# Patient Record
Sex: Female | Born: 1955 | Race: White | Hispanic: No | Marital: Married | Smoking: Never smoker
Health system: Southern US, Community
[De-identification: ages and names within clinical notes are randomized; demographics above are authoritative.]

## PROBLEM LIST (undated history)

## (undated) DIAGNOSIS — N83209 Unspecified ovarian cyst, unspecified side: Secondary | ICD-10-CM

## (undated) DIAGNOSIS — E039 Hypothyroidism, unspecified: Secondary | ICD-10-CM

## (undated) DIAGNOSIS — E7800 Pure hypercholesterolemia, unspecified: Secondary | ICD-10-CM

## (undated) DIAGNOSIS — R002 Palpitations: Secondary | ICD-10-CM

## (undated) DIAGNOSIS — I8390 Asymptomatic varicose veins of unspecified lower extremity: Secondary | ICD-10-CM

## (undated) DIAGNOSIS — M1990 Unspecified osteoarthritis, unspecified site: Secondary | ICD-10-CM

## (undated) HISTORY — DX: Pure hypercholesterolemia, unspecified: E78.00

## (undated) HISTORY — PX: TONSILLECTOMY AND ADENOIDECTOMY: SHX28

## (undated) HISTORY — DX: Unspecified osteoarthritis, unspecified site: M19.90

## (undated) HISTORY — PX: WISDOM TOOTH EXTRACTION: SHX21

## (undated) HISTORY — DX: Hypothyroidism, unspecified: E03.9

## (undated) HISTORY — DX: Unspecified ovarian cyst, unspecified side: N83.209

## (undated) HISTORY — DX: Asymptomatic varicose veins of unspecified lower extremity: I83.90

## (undated) HISTORY — DX: Palpitations: R00.2

---

## 1998-11-17 ENCOUNTER — Other Ambulatory Visit: Admission: RE | Admit: 1998-11-17 | Discharge: 1998-11-17 | Payer: Self-pay | Admitting: Obstetrics and Gynecology

## 1998-12-02 ENCOUNTER — Encounter: Admission: RE | Admit: 1998-12-02 | Discharge: 1999-02-02 | Payer: Self-pay | Admitting: Family Medicine

## 1998-12-11 ENCOUNTER — Encounter: Payer: Self-pay | Admitting: Obstetrics and Gynecology

## 1998-12-11 ENCOUNTER — Ambulatory Visit (HOSPITAL_COMMUNITY): Admission: RE | Admit: 1998-12-11 | Discharge: 1998-12-11 | Payer: Self-pay | Admitting: Obstetrics and Gynecology

## 1998-12-31 ENCOUNTER — Encounter: Payer: Self-pay | Admitting: Obstetrics and Gynecology

## 1998-12-31 ENCOUNTER — Ambulatory Visit (HOSPITAL_COMMUNITY): Admission: RE | Admit: 1998-12-31 | Discharge: 1998-12-31 | Payer: Self-pay | Admitting: Obstetrics and Gynecology

## 1999-10-13 ENCOUNTER — Other Ambulatory Visit: Admission: RE | Admit: 1999-10-13 | Discharge: 1999-10-13 | Payer: Self-pay | Admitting: Obstetrics and Gynecology

## 1999-10-13 ENCOUNTER — Encounter (INDEPENDENT_AMBULATORY_CARE_PROVIDER_SITE_OTHER): Payer: Self-pay | Admitting: Specialist

## 2000-01-01 ENCOUNTER — Encounter: Payer: Self-pay | Admitting: Obstetrics and Gynecology

## 2000-01-01 ENCOUNTER — Ambulatory Visit (HOSPITAL_COMMUNITY): Admission: RE | Admit: 2000-01-01 | Discharge: 2000-01-01 | Payer: Self-pay | Admitting: Obstetrics and Gynecology

## 2000-01-18 ENCOUNTER — Other Ambulatory Visit: Admission: RE | Admit: 2000-01-18 | Discharge: 2000-01-18 | Payer: Self-pay | Admitting: Obstetrics and Gynecology

## 2001-01-09 ENCOUNTER — Ambulatory Visit (HOSPITAL_COMMUNITY): Admission: RE | Admit: 2001-01-09 | Discharge: 2001-01-09 | Payer: Self-pay | Admitting: Obstetrics and Gynecology

## 2001-01-09 ENCOUNTER — Encounter: Payer: Self-pay | Admitting: Obstetrics and Gynecology

## 2001-01-30 ENCOUNTER — Other Ambulatory Visit: Admission: RE | Admit: 2001-01-30 | Discharge: 2001-01-30 | Payer: Self-pay | Admitting: Obstetrics and Gynecology

## 2002-01-12 ENCOUNTER — Ambulatory Visit (HOSPITAL_COMMUNITY): Admission: RE | Admit: 2002-01-12 | Discharge: 2002-01-12 | Payer: Self-pay | Admitting: Obstetrics and Gynecology

## 2002-01-12 ENCOUNTER — Encounter: Payer: Self-pay | Admitting: Obstetrics and Gynecology

## 2002-01-30 ENCOUNTER — Other Ambulatory Visit: Admission: RE | Admit: 2002-01-30 | Discharge: 2002-01-30 | Payer: Self-pay | Admitting: Obstetrics and Gynecology

## 2003-01-21 ENCOUNTER — Encounter: Payer: Self-pay | Admitting: Obstetrics and Gynecology

## 2003-01-21 ENCOUNTER — Ambulatory Visit (HOSPITAL_COMMUNITY): Admission: RE | Admit: 2003-01-21 | Discharge: 2003-01-21 | Payer: Self-pay | Admitting: Obstetrics and Gynecology

## 2003-02-04 ENCOUNTER — Other Ambulatory Visit: Admission: RE | Admit: 2003-02-04 | Discharge: 2003-02-04 | Payer: Self-pay | Admitting: Obstetrics and Gynecology

## 2004-01-28 ENCOUNTER — Ambulatory Visit (HOSPITAL_COMMUNITY): Admission: RE | Admit: 2004-01-28 | Discharge: 2004-01-28 | Payer: Self-pay | Admitting: Obstetrics and Gynecology

## 2004-02-27 ENCOUNTER — Other Ambulatory Visit: Admission: RE | Admit: 2004-02-27 | Discharge: 2004-02-27 | Payer: Self-pay | Admitting: Obstetrics and Gynecology

## 2004-12-08 ENCOUNTER — Ambulatory Visit: Payer: Self-pay | Admitting: Internal Medicine

## 2004-12-09 ENCOUNTER — Ambulatory Visit: Payer: Self-pay | Admitting: Internal Medicine

## 2005-02-03 ENCOUNTER — Encounter: Admission: RE | Admit: 2005-02-03 | Discharge: 2005-02-03 | Payer: Self-pay | Admitting: Podiatry

## 2005-02-22 ENCOUNTER — Ambulatory Visit (HOSPITAL_COMMUNITY): Admission: RE | Admit: 2005-02-22 | Discharge: 2005-02-22 | Payer: Self-pay | Admitting: Obstetrics and Gynecology

## 2005-03-08 ENCOUNTER — Other Ambulatory Visit: Admission: RE | Admit: 2005-03-08 | Discharge: 2005-03-08 | Payer: Self-pay | Admitting: Obstetrics and Gynecology

## 2005-07-15 ENCOUNTER — Ambulatory Visit: Payer: Self-pay | Admitting: Internal Medicine

## 2005-07-22 ENCOUNTER — Ambulatory Visit: Payer: Self-pay | Admitting: Internal Medicine

## 2005-08-03 ENCOUNTER — Ambulatory Visit: Payer: Self-pay | Admitting: Internal Medicine

## 2005-08-12 ENCOUNTER — Ambulatory Visit: Payer: Self-pay

## 2005-08-20 ENCOUNTER — Ambulatory Visit: Payer: Self-pay | Admitting: Internal Medicine

## 2005-08-30 ENCOUNTER — Ambulatory Visit: Payer: Self-pay | Admitting: Cardiology

## 2005-10-04 HISTORY — PX: COLONOSCOPY: SHX174

## 2005-10-22 ENCOUNTER — Ambulatory Visit: Payer: Self-pay | Admitting: Gastroenterology

## 2005-11-16 ENCOUNTER — Ambulatory Visit: Payer: Self-pay | Admitting: Gastroenterology

## 2006-01-28 ENCOUNTER — Ambulatory Visit: Payer: Self-pay | Admitting: Internal Medicine

## 2006-03-01 ENCOUNTER — Ambulatory Visit (HOSPITAL_COMMUNITY): Admission: RE | Admit: 2006-03-01 | Discharge: 2006-03-01 | Payer: Self-pay | Admitting: Obstetrics and Gynecology

## 2006-03-09 ENCOUNTER — Other Ambulatory Visit: Admission: RE | Admit: 2006-03-09 | Discharge: 2006-03-09 | Payer: Self-pay | Admitting: Obstetrics and Gynecology

## 2006-06-27 ENCOUNTER — Ambulatory Visit: Payer: Self-pay | Admitting: Internal Medicine

## 2006-06-30 ENCOUNTER — Ambulatory Visit: Payer: Self-pay | Admitting: Internal Medicine

## 2007-02-16 ENCOUNTER — Encounter: Payer: Self-pay | Admitting: Internal Medicine

## 2007-02-16 ENCOUNTER — Ambulatory Visit: Payer: Self-pay | Admitting: Internal Medicine

## 2007-02-16 DIAGNOSIS — M545 Low back pain, unspecified: Secondary | ICD-10-CM | POA: Insufficient documentation

## 2007-02-16 DIAGNOSIS — Z8719 Personal history of other diseases of the digestive system: Secondary | ICD-10-CM | POA: Insufficient documentation

## 2007-02-16 DIAGNOSIS — E785 Hyperlipidemia, unspecified: Secondary | ICD-10-CM | POA: Insufficient documentation

## 2007-02-16 LAB — CONVERTED CEMR LAB
Basophils Relative: 0.2 % (ref 0.0–1.0)
Bilirubin Urine: NEGATIVE
Direct LDL: 155.8 mg/dL
Eosinophils Relative: 4.3 % (ref 0.0–5.0)
HDL: 53 mg/dL (ref >39.0)
Ketones, urine, test strip: NEGATIVE
Lymphocytes Relative: 23.7 % (ref 12.0–46.0)
Monocytes Absolute: 0.5 10*3/uL (ref 0.2–0.7)
Platelets: 289 10*3/uL (ref 150–400)
Protein, U semiquant: NEGATIVE
RBC: 4.56 M/uL (ref 3.87–5.11)
RDW: 12.6 % (ref 11.5–14.6)
Triglycerides: 116 mg/dL (ref 0–149)
Urobilinogen, UA: NEGATIVE
pH: 5

## 2007-02-21 ENCOUNTER — Encounter: Admission: RE | Admit: 2007-02-21 | Discharge: 2007-02-21 | Payer: Self-pay | Admitting: Internal Medicine

## 2007-02-22 ENCOUNTER — Telehealth: Payer: Self-pay | Admitting: Internal Medicine

## 2007-03-02 ENCOUNTER — Telehealth (INDEPENDENT_AMBULATORY_CARE_PROVIDER_SITE_OTHER): Payer: Self-pay | Admitting: *Deleted

## 2007-03-03 ENCOUNTER — Ambulatory Visit (HOSPITAL_COMMUNITY): Admission: RE | Admit: 2007-03-03 | Discharge: 2007-03-03 | Payer: Self-pay | Admitting: Obstetrics and Gynecology

## 2007-03-13 ENCOUNTER — Other Ambulatory Visit: Admission: RE | Admit: 2007-03-13 | Discharge: 2007-03-13 | Payer: Self-pay | Admitting: Obstetrics and Gynecology

## 2007-03-27 ENCOUNTER — Ambulatory Visit: Payer: Self-pay | Admitting: Internal Medicine

## 2007-03-27 LAB — CONVERTED CEMR LAB
Glucose, Urine, Semiquant: NEGATIVE
Specific Gravity, Urine: 1.01
WBC Urine, dipstick: NEGATIVE
pH: 5

## 2007-04-05 ENCOUNTER — Ambulatory Visit: Payer: Self-pay | Admitting: Internal Medicine

## 2007-04-17 ENCOUNTER — Encounter (INDEPENDENT_AMBULATORY_CARE_PROVIDER_SITE_OTHER): Payer: Self-pay | Admitting: *Deleted

## 2007-06-01 ENCOUNTER — Encounter: Payer: Self-pay | Admitting: Internal Medicine

## 2007-06-06 ENCOUNTER — Ambulatory Visit: Payer: Self-pay | Admitting: Internal Medicine

## 2007-06-22 ENCOUNTER — Encounter: Payer: Self-pay | Admitting: Internal Medicine

## 2007-07-10 ENCOUNTER — Encounter: Payer: Self-pay | Admitting: Internal Medicine

## 2007-08-02 ENCOUNTER — Inpatient Hospital Stay (HOSPITAL_COMMUNITY): Admission: EM | Admit: 2007-08-02 | Discharge: 2007-08-03 | Payer: Self-pay | Admitting: Emergency Medicine

## 2007-08-02 ENCOUNTER — Encounter (INDEPENDENT_AMBULATORY_CARE_PROVIDER_SITE_OTHER): Payer: Self-pay | Admitting: Emergency Medicine

## 2007-08-22 ENCOUNTER — Encounter: Payer: Self-pay | Admitting: Internal Medicine

## 2008-03-12 ENCOUNTER — Ambulatory Visit: Payer: Self-pay | Admitting: Internal Medicine

## 2008-03-15 ENCOUNTER — Ambulatory Visit: Payer: Self-pay | Admitting: Internal Medicine

## 2008-03-18 ENCOUNTER — Telehealth (INDEPENDENT_AMBULATORY_CARE_PROVIDER_SITE_OTHER): Payer: Self-pay | Admitting: *Deleted

## 2008-03-18 LAB — CONVERTED CEMR LAB
BUN: 11 mg/dL (ref 6–23)
Chloride: 106 meq/L (ref 96–112)
Creatinine, Ser: 0.8 mg/dL (ref 0.4–1.2)
Direct LDL: 165.9 mg/dL
Potassium: 4 meq/L (ref 3.5–5.1)
TSH: 9.47 microintl units/mL — ABNORMAL HIGH (ref 0.35–5.50)
Total CHOL/HDL Ratio: 5.2

## 2008-03-19 LAB — CONVERTED CEMR LAB: Vit D, 1,25-Dihydroxy: 38 (ref 30–89)

## 2008-03-20 ENCOUNTER — Telehealth: Payer: Self-pay | Admitting: Internal Medicine

## 2008-05-17 ENCOUNTER — Ambulatory Visit: Payer: Self-pay | Admitting: Internal Medicine

## 2008-05-21 ENCOUNTER — Telehealth (INDEPENDENT_AMBULATORY_CARE_PROVIDER_SITE_OTHER): Payer: Self-pay | Admitting: *Deleted

## 2008-10-04 HISTORY — PX: APPENDECTOMY: SHX54

## 2008-10-15 ENCOUNTER — Ambulatory Visit: Payer: Self-pay | Admitting: Internal Medicine

## 2008-10-18 ENCOUNTER — Telehealth (INDEPENDENT_AMBULATORY_CARE_PROVIDER_SITE_OTHER): Payer: Self-pay | Admitting: *Deleted

## 2008-10-18 LAB — CONVERTED CEMR LAB
Cholesterol: 243 mg/dL (ref 0–200)
TSH: 2.68 microintl units/mL (ref 0.35–5.50)
VLDL: 27 mg/dL (ref 0–40)

## 2009-01-30 ENCOUNTER — Encounter: Admission: RE | Admit: 2009-01-30 | Discharge: 2009-01-30 | Payer: Self-pay | Admitting: Podiatry

## 2009-03-04 ENCOUNTER — Ambulatory Visit (HOSPITAL_COMMUNITY): Admission: RE | Admit: 2009-03-04 | Discharge: 2009-03-04 | Payer: Self-pay | Admitting: Obstetrics and Gynecology

## 2009-05-29 ENCOUNTER — Encounter: Payer: Self-pay | Admitting: Internal Medicine

## 2009-06-23 ENCOUNTER — Encounter: Payer: Self-pay | Admitting: Internal Medicine

## 2009-09-29 ENCOUNTER — Ambulatory Visit (HOSPITAL_COMMUNITY): Admission: RE | Admit: 2009-09-29 | Discharge: 2009-09-29 | Payer: Self-pay | Admitting: Internal Medicine

## 2009-12-16 ENCOUNTER — Telehealth (INDEPENDENT_AMBULATORY_CARE_PROVIDER_SITE_OTHER): Payer: Self-pay | Admitting: *Deleted

## 2010-01-20 ENCOUNTER — Telehealth (INDEPENDENT_AMBULATORY_CARE_PROVIDER_SITE_OTHER): Payer: Self-pay | Admitting: *Deleted

## 2010-02-26 ENCOUNTER — Ambulatory Visit: Payer: Self-pay | Admitting: Internal Medicine

## 2010-02-26 DIAGNOSIS — E039 Hypothyroidism, unspecified: Secondary | ICD-10-CM | POA: Insufficient documentation

## 2010-02-27 ENCOUNTER — Telehealth (INDEPENDENT_AMBULATORY_CARE_PROVIDER_SITE_OTHER): Payer: Self-pay | Admitting: *Deleted

## 2010-02-27 LAB — CONVERTED CEMR LAB
AST: 27 units/L (ref 0–37)
Basophils Absolute: 0 10*3/uL (ref 0.0–0.1)
Bilirubin, Direct: 0.1 mg/dL (ref 0.0–0.3)
Creatinine, Ser: 0.8 mg/dL (ref 0.4–1.2)
Eosinophils Absolute: 0.3 10*3/uL (ref 0.0–0.7)
Eosinophils Relative: 7.6 % — ABNORMAL HIGH (ref 0.0–5.0)
GFR calc non Af Amer: 82.75 mL/min (ref >60)
HDL: 60.7 mg/dL (ref >39.00)
Lymphocytes Relative: 23.4 % (ref 12.0–46.0)
MCV: 96.5 fL (ref 78.0–100.0)
Monocytes Absolute: 0.4 10*3/uL (ref 0.1–1.0)
Neutrophils Relative %: 59.7 % (ref 43.0–77.0)
Platelets: 298 10*3/uL (ref 150.0–400.0)
RBC: 4.37 M/uL (ref 3.87–5.11)
RDW: 14.3 % (ref 11.5–14.6)
Sodium: 140 meq/L (ref 135–145)
TSH: 3.28 microintl units/mL (ref 0.35–5.50)
Total Bilirubin: 0.5 mg/dL (ref 0.3–1.2)
Total CHOL/HDL Ratio: 4
Triglycerides: 90 mg/dL (ref 0.0–149.0)

## 2010-03-19 ENCOUNTER — Ambulatory Visit (HOSPITAL_COMMUNITY): Admission: RE | Admit: 2010-03-19 | Discharge: 2010-03-19 | Payer: Self-pay | Admitting: Obstetrics and Gynecology

## 2010-08-25 ENCOUNTER — Telehealth: Payer: Self-pay | Admitting: Internal Medicine

## 2010-10-25 ENCOUNTER — Encounter: Payer: Self-pay | Admitting: Family Medicine

## 2010-10-26 ENCOUNTER — Encounter: Payer: Self-pay | Admitting: Obstetrics and Gynecology

## 2010-11-01 LAB — CONVERTED CEMR LAB: Pap Smear: NORMAL

## 2010-11-05 NOTE — Progress Notes (Signed)
Summary: due cpx  Phone Note Outgoing Call Call back at [facility] Phone [phone] Call back at Work Phone [phone]   Summary of Call: DUE CPX - FASTING LABS Bonnie Barron  December 16, 2009 11:15 AM     Additional Follow-up for Phone Call Additional follow up Details #2::    LMTCB.Marland KitchenMarland KitchenBarb Merino  December 16, 2009 11:26 AM  PATIENT HAS AN APPT 02/26/2010.Marland KitchenMarland KitchenBarb Merino  December 16, 2009 3:01 PM  Follow-up by: [name],  December 16, 2009 11:27 AM

## 2010-11-05 NOTE — Progress Notes (Signed)
Summary: levothyroxine refill  Phone Note Refill Request Message from:  Fax from Pharmacy on August 25, 2010 9:50 AM  Refills Requested: Medication #1:  SYNTHROID 50 MCG  TABS 1 by mouth once daily - NO ADDITIONAL REFILLS WITHOUT OFFICE VISIT [facility].  [address],  [place], [place]    [phone]    fax [phone]     qty 30  Next Appointment Scheduled: none Initial call taken by: Jerolyn Shin,  August 25, 2010 9:51 AM  Follow-up for Phone Call        send Rx, ok 90 and 3RF Follow-up by: [facility] E. Paz MD,  August 25, 2010 4:47 PM    New/Updated Medications: SYNTHROID 50 MCG  TABS (LEVOTHYROXINE SODIUM) 1 by mouth once daily Prescriptions: SYNTHROID 50 MCG  TABS (LEVOTHYROXINE SODIUM) 1 by mouth once daily  #90 x 3   Entered and Authorized by:   Nolon Rod. Paz MD   Signed by:   Army Fossa CMA on 08/25/2010   Method used:   Electronically to        [facility]* (retail)       [address]       [place], [place]  [address]       Ph: [phone]       Fax: [phone]   RxID:   [id number]

## 2010-11-05 NOTE — Assessment & Plan Note (Signed)
Summary: CPX/FASTING/KDC   Vital Signs:  Patient profile:   55 year old female Height:      66.5 inches Weight:      140.38 pounds BMI:     22.40 Pulse rate:   68 / minute BP sitting:   120 / 70  Vitals Entered By: Kandice Hams (Feb 26, 2010 9:15 AM) CC: cpx   History of Present Illness: CPX feels well   Allergies: 1)  ! Morphine  Past History:  Past Medical History: G2 P2 HYPERLIPIDEMIA, BORDERLINE LUMBAGO-- saw Dr Ethelene Hal  2008, Dx w/ OA; declined local injection COLONOSCOPY 2007 , neg Cardiolite  neg. 08-2005 palpitations, saw cards 2010, ECHO was told neg   Past Surgical History: Reviewed history from 03/12/2008 and no changes required. Appendectomy (07/2007)  Family History: CAD - F (dx in his 65s), M ( dx in her 74s, still living) DM - grandparents  HTN - M, F stroke - F colon Ca - no breast Ca - P aunt dementia - F  Social History: Married, husband had a CABG 2 childred tobacco-- no ETOH-- socially exercise-- walks, active, work out  diet-- eats healthy   Review of Systems General:  Denies fatigue and fever. CV:  Denies chest pain or discomfort and swelling of feet; had palpitations last year saw cards, ECHO neg ,  red yeast rice for high cholesterol, it worked very good, total cholesterol went down to 160 approximately. Resp:  Denies cough and shortness of breath. GI:  Denies bloody stools, diarrhea, nausea, and vomiting. GU:  sees gyn . Psych:  + stress but doing ok .  Physical Exam  General:  alert, well-developed, and well-nourished.   Neck:  no thyromegaly and normal carotid upstroke.   Lungs:  normal respiratory effort, no intercostal retractions, no accessory muscle use, and normal breath sounds.   Heart:  normal rate, regular rhythm, no murmur, and no gallop.   Abdomen:  soft, non-tender, no distention, no masses, no guarding, and no rigidity.   Extremities:  no pretibial edema bilaterally  Psych:  Cognition and judgment appear  intact. Alert and cooperative with normal attention span and concentration.  not anxious appearing and not depressed appearing.     Impression & Recommendations:  Problem # 1:  HEALTH SCREENING (ICD-V70.0) cont w/ healthy life style Cscope (-) 2007, next 2017  Dr Russella Dar female care per gyn  Orders: Venipuncture (82993) TLB-BMP (Basic Metabolic Panel-BMET) (80048-METABOL) TLB-CBC Platelet - w/Differential (85025-CBCD) TLB-Hepatic/Liver Function Pnl (80076-HEPATIC) T-Vitamin D (25-Hydroxy) (71696-78938)  Problem # 2:  HYPERLIPIDEMIA, BORDERLINE (ICD-272.4) long history of borderline hyperlipidemia  despite very good lifestyle started red yeast rice few months ago as recommended by cardiology, her total chol. went  down from the 230s to the 160s. She is currently taking 2 tablets Labs Orders: TLB-Lipid Panel (80061-LIPID)    HDL:51.0 (10/15/2008), 43.6 (03/15/2008)  LDL:DEL (10/15/2008), DEL (03/15/2008)  Chol:243 (10/15/2008), 228 (03/15/2008)  Trig:133 (10/15/2008), 129 (03/15/2008)  Problem # 3:  HYPOTHYROIDISM (ICD-244.9) mild hypothyroidism, labs Her updated medication list for this problem includes:    Synthroid 50 Mcg Tabs (Levothyroxine sodium) .Marland Kitchen... 1 by mouth once daily - no additional refills without office visit  Orders: TLB-TSH (Thyroid Stimulating Hormone) (84443-TSH)  Labs Reviewed: TSH: 2.68 (10/15/2008)    Chol: 243 (10/15/2008)   HDL: 51.0 (10/15/2008)   LDL: DEL (10/15/2008)   TG: 133 (10/15/2008)  Complete Medication List: 1)  Loestrin 1/20 (21) 1-20 Mg-mcg Tabs (Norethindrone acet-ethinyl est) 2)  Omega 3  3)  Mvi  4)  Synthroid 50 Mcg Tabs (Levothyroxine sodium) .Marland Kitchen.. 1 by mouth once daily - no additional refills without office visit 5)  Red Yeast Rice  .Marland Kitchen.. 2 a day 6)  Probiotic  .Marland Kitchen.. 1 a day 7)  Calcium  .Marland Kitchen.. 1 a day 8)  Vitamin D  .... 1 a day  Patient Instructions: 1)  Please schedule a follow-up appointment in 1 year.

## 2010-11-05 NOTE — Progress Notes (Signed)
Summary: results  Phone Note Outgoing Call Call back at Home Phone [phone] Call back at Work Phone [phone]   Reason for Call: Discuss lab or test results Details for Reason: advised patient Vitamin D is very good, in fact she can take half of her current vitamin D dose her cholesterol is good, total cholesterol 219 and the  LDL 152,  lower than her usual numbers . For now continue with red rice yeast , she can increase from 2 to 3  tablets a day  thyroid within normal, continue with Synthroid Good results   Signed by Nolon Rod. Paz MD on 02/27/2010 at 2:45 PM  Summary of Call: left message on machine for pt to return call, copy of labs mailed to pt............... Shary Decamp  Feb 27, 2010 2:49 PM discussed with pt............ Shary Decamp  Feb 27, 2010 3:56 PM

## 2010-11-05 NOTE — Progress Notes (Signed)
Summary: rx  Phone Note Refill Request Message from:  [facility] fax [phone]  Refills Requested: Medication #1:  SYNTHROID 50 MCG  TABS 1 by mouth once daily - NO ADDITIONAL REFILLS WITHOUT OFFICE VISIT. Initial call taken by: Kandice Hams,  January 20, 2010 9:54 AM    Prescriptions: SYNTHROID 50 MCG  TABS (LEVOTHYROXINE SODIUM) 1 by mouth once daily - NO ADDITIONAL REFILLS WITHOUT OFFICE VISIT  #30 x 0   Entered by:   Kandice Hams   Authorized by:   Nolon Rod. Paz MD   Signed by:   Kandice Hams on 01/20/2010   Method used:   Faxed to ...       [facility]* (retail)       [address]       [place], [place]  [address]       Ph: [phone]       Fax: [phone]   RxID:   [id number]

## 2010-12-07 ENCOUNTER — Ambulatory Visit (INDEPENDENT_AMBULATORY_CARE_PROVIDER_SITE_OTHER): Payer: Managed Care, Other (non HMO) | Admitting: Internal Medicine

## 2010-12-07 ENCOUNTER — Other Ambulatory Visit: Payer: Self-pay | Admitting: Internal Medicine

## 2010-12-07 ENCOUNTER — Encounter: Payer: Self-pay | Admitting: Internal Medicine

## 2010-12-07 DIAGNOSIS — E039 Hypothyroidism, unspecified: Secondary | ICD-10-CM

## 2010-12-07 DIAGNOSIS — M2550 Pain in unspecified joint: Secondary | ICD-10-CM

## 2010-12-08 LAB — CBC WITH DIFFERENTIAL/PLATELET
Basophils Absolute: 0 10*3/uL (ref 0.0–0.1)
Basophils Relative: 0.5 % (ref 0.0–3.0)
Eosinophils Absolute: 0.4 10*3/uL (ref 0.0–0.7)
Lymphs Abs: 1.6 10*3/uL (ref 0.7–4.0)
MCV: 92 fl (ref 78.0–100.0)
Monocytes Absolute: 0.4 10*3/uL (ref 0.1–1.0)
Monocytes Relative: 9.7 % (ref 3.0–12.0)
Neutro Abs: 2.1 10*3/uL (ref 1.4–7.7)
RBC: 4.36 Mil/uL (ref 3.87–5.11)
RDW: 13 % (ref 11.5–14.6)

## 2010-12-15 NOTE — Assessment & Plan Note (Signed)
Summary: pain shoulder & thigh/cbs   Vital Signs:  Patient profile:   55 year old female Height:      66.5 inches Weight:      143.13 pounds BMI:     22.84 Pulse rate:   68 / minute Pulse rhythm:   regular BP sitting:   124 / 82  (left arm) Cuff size:   large  Vitals Entered By: Army Fossa CMA (December 07, 2010 2:42 PM) CC: Pt here c/o (R) shoulder and (B) legs cause pain Comments x 5 months mostly @ night when laying down [facility]    History of Present Illness: several monthsago developed  leg pain, mostly at  night and located at both thighs she went to cards,was reommended  to stop red rice yeast   as it may cause myalgias that helped temporarily but then pain resurface  ~ 4 months ago, primarily concentrated aroiund the throcanteric area .  also developed R shoulder pain,  the pain can be intense at times, it  is on and off, bothers her a lot at night and is not changed by exercises or arm use  She went to a urgent care, x-rays were done from her shoulder, was told she had mild DJD  changes, prescribed meloxicam. She couldn't tolerate Meloxicam due to GI symptoms  ROS Denies fevers or  weight loss No headaches Has not started any particular exercise program, she's not doing anything different. She remains active as usual. In fact she has tried to avoid exercise  but  that has not impacted her pain   6 months ago she did stop her birth control pills under the guidance of her gynecologist. She is not emotional, she has developed some hot flashes. She notices that in general her energy level has decreased and her  appetite has gone up.  Denies any classic claudication She has mild, chronic neck and back pain, they are not new symptoms.   Allergies: 1)  ! Morphine  Past History:  Past Medical History: Reviewed history from 02/26/2010 and no changes required. G2 P2 HYPERLIPIDEMIA, BORDERLINE LUMBAGO-- saw Dr Ethelene Hal  2008, Dx w/ OA; declined local  injection COLONOSCOPY 2007 , neg Cardiolite  neg. 08-2005 palpitations, saw cards 2010, ECHO was told neg   Past Surgical History: Reviewed history from 03/12/2008 and no changes required. Appendectomy (07/2007)  Social History: Reviewed history from 02/26/2010 and no changes required. Married, husband had a CABG 2 childred tobacco-- no ETOH-- socially exercise-- walks, active, work out  diet-- eats healthy   Physical Exam  General:  alert, well-developed, and well-nourished.   no apparent distress Head:   palpation over the temples showed no tenderness or swelling Neck:  no masses, no thyromegaly, and normal carotid upstroke.   Lungs:  normal respiratory effort, no intercostal retractions, no accessory muscle use, and normal breath sounds.   Msk:   rotation of the hips normal bilaterally, no pain. Left shoulder normal Right shoulder : range of motion is slightly limited due to pain when elevate the R  arm. no deformities or swelling.  she's not particularly tender at either trochanteric area Pulses:  normal pedal and femoral pulses bilaterally   Extremities:  no pretibial edema bilaterally    Impression & Recommendations:  Problem # 1:  PAIN IN JOINT, MULTIPLE SITES (ICD-719.49) Assessment New pain in the hips and right shoulder Review of systems negative for possible  polymyalgia rheumatica  she is  intolerant to meloxicam Plan:  labs  If labs negative, refer to ortho Tylenol  Orders: TLB-Sedimentation Rate (ESR) (85652-ESR) TLB-CBC Platelet - w/Differential (85025-CBCD) TLB-CK Total Only(Creatine Kinase/CPK) (82550-CK) Specimen Handling (16109)  Problem # 2:  HYPOTHYROIDISM (ICD-244.9)  some fatigue for a few moments, HRT was discontinued by gynecology  we'll check a TSH to be sure they lack of energy is not related with thyroid disease Her updated medication list for this problem includes:    Synthroid 50 Mcg Tabs (Levothyroxine sodium) .Marland Kitchen... 1 by mouth once  daily  Orders: Venipuncture (60454) TLB-TSH (Thyroid Stimulating Hormone) (84443-TSH) Specimen Handling (09811)  Complete Medication List: 1)  Omega 3  2)  Mvi  3)  Synthroid 50 Mcg Tabs (Levothyroxine sodium) .Marland Kitchen.. 1 by mouth once daily 4)  Probiotic  .Marland Kitchen.. 1 a day 5)  Calcium  .Marland Kitchen.. 1 a day  Patient Instructions: 1)   if you are not better in a few weeks but me know 2)  For pain, Tylenol 500 mg 2 tablets every 6 hours as needed. No more than 8 tablets daily 3)  Please schedule a follow-up appointment in 4 months .  fasting, physical exam   Orders Added: 1)  Venipuncture [36415] 2)  TLB-TSH (Thyroid Stimulating Hormone) [84443-TSH] 3)  TLB-Sedimentation Rate (ESR) [85652-ESR] 4)  TLB-CBC Platelet - w/Differential [85025-CBCD] 5)  TLB-CK Total Only(Creatine Kinase/CPK) [82550-CK] 6)  Specimen Handling [99000] 7)  Est. Patient Level IV [91478]

## 2011-01-04 ENCOUNTER — Ambulatory Visit
Admission: RE | Admit: 2011-01-04 | Discharge: 2011-01-04 | Disposition: A | Discharge status: Home / Self Care | Payer: Managed Care, Other (non HMO) | Source: Ambulatory Visit | Attending: Family Medicine | Admitting: Family Medicine

## 2011-01-04 ENCOUNTER — Other Ambulatory Visit: Payer: Self-pay | Admitting: Family Medicine

## 2011-01-04 DIAGNOSIS — R609 Edema, unspecified: Secondary | ICD-10-CM

## 2011-01-04 DIAGNOSIS — R52 Pain, unspecified: Secondary | ICD-10-CM

## 2011-02-16 NOTE — Op Note (Signed)
Bonnie Barron, Bonnie Barron                ACCOUNT NO.:  [id number]   MEDICAL RECORD NO.:  [id number]          PATIENT TYPE:  INP   LOCATION:  1525                         FACILITY:  [facility]   PHYSICIAN:  Lennie Muckle, MD      DATE OF BIRTH:  [birth year]   DATE OF PROCEDURE:  08/02/2007  DATE OF DISCHARGE:                               OPERATIVE REPORT   PREOPERATIVE DIAGNOSIS:  Acute appendicitis.   POSTOPERATIVE DIAGNOSIS:  Acute appendicitis.   OPERATION PERFORMED:  Laparoscopic appendectomy.   SURGEON:  Lennie Muckle, MD   INDICATIONS FOR PROCEDURE:  [name] is a 55 year old female who came  to the emergency room due to right lower abdominal pain which started  approximately 8:30 on August 01, 2007.  The pain was nonrelenting.  She  had a CT scan which showed inflammation around the cecal area.  Exam was  consistent with acute appendicitis.  She received preop Zosyn.  Informed  consent was obtained.   SPECIMENS:  Appendix.   ANESTHESIA:  General endotracheal.   ESTIMATED BLOOD LOSS:  Minimal.   DRAINS:  None.   COMPLICATIONS:  No immediate complications.   DESCRIPTION OF PROCEDURE:  [name] was identified in the  preoperative holding room where she was taken to the operating suite.  She was placed in supine position, received general endotracheal  anesthesia.  Her abdomen was prepped and draped in the usual sterile  fashion.  Appropriate time out procedure indicating patient and  procedure was performed.  Using 0.25% Marcaine and 1% lidocaine the  infraumbilical skin was anesthetized and incision placed using the  Optivu #11 mm trocar was placed in the abdominal cavity under direct  visualization.  After obtaining pneumoperitoneum, the abdomen was  inspected and there was no evidence of injury upon entry into the  abdominal cavity.  A 5 mm trocar was placed in the left lower quadrant  and an additional 5 mm trocar was placed in the right upper quadrant  under  visualization with the camera.  Using blunt graspers, the cecum  which was in the right lower quadrant down near the pelvis was  identified.  The appendix was posterior to the cecum and draping down  into the pelvis there was a small amount of fluid within the abdominal  cavity.  Mild amount of inflammation near the appendix.  The tip of the  appendix was dissected away from the surrounding peritoneum using  electrocautery as well as blunt dissection.  I was able to fully dissect  the peritoneum laterally to pull the appendix medially.  I dissected  near the base of the appendix in order to place the laparoscopic stapler  at the base of the appendix.  This was stapled away from the cecum.  The  appendix mesentery was also stapled across using a vascular load on a  laparoscopic stapler.  The specimen was removed in the Endocatch bag  through the infraumbilical incision.  The abdomen was irrigated.  There  was no evidence of bleeding at the end of the case.  The fascia was  closed  with 0 Vicryl suture at the infraumbilical incision site and was  closed with 4-0 Monocryls.  Steri-Strips were placed with final  dressing.  The patient was then extubated and transported to post  anesthesia care unit in stable condition.      Lennie Muckle, MD  Electronically Signed     ALA/MEDQ  D:  08/02/2007  T:  08/03/2007  Job:  [id number]

## 2011-02-16 NOTE — H&P (Signed)
Bonnie Barron, ROBB                ACCOUNT NO.:  [id number]   MEDICAL RECORD NO.:  [id number]          PATIENT TYPE:  INP   LOCATION:  0108                         [facility]AC[facility]   PHYSICIAN:  Lennie Muckle, MD      DATE OF BIRTH:  06/14/1956   DATE OF ADMISSION:  08/02/2007  DATE OF DISCHARGE:                              HISTORY & PHYSICAL   HISTORY OF PRESENT ILLNESS:  The patient is a 55 year old female, who  began having suprapubic discomfort around 8:30 last night.  She states  over the course of the night the pain was persistent, which progressed  up into her upper quadrant and right flank area.  She states she did  have fevers and chills at home.  Due to the persistence of the pain and  nausea, she came to the emergency room at approximately 4 this morning.  She has not had any diarrhea and has no emesis.  A computed tomography  scan was obtained due to the complaints of the right flank pain.  Unfortunately this was performed without contrast, but the radiologist  said they were able to see inflammation around the appendix.  She also  had a white count elevated to 14,000.   PAST MEDICAL HISTORY:  Past medical history is significant for no  significant past medical history.   SOCIAL HISTORY:  No tobacco or alcohol use.   FAMILY HISTORY:  There is a family history of hypertension.   MEDICATIONS:  Medications include birth control pills.   ALLERGIES:  NO ALLERGIES, BUT SHE DID HAVE A RASH FROM THE MORPHINE  INJECTION A SHORT TIME AGO.   REVIEW OF SYSTEMS:  Review of systems, obtained from the patient, she  says she did have a history of heart palpitations and had a workup which  was negative.  No pulmonary disease, renal disease, or gastrointestinal  disease.  She states she does have a sensitive stomach.   PHYSICAL EXAMINATION:  GENERAL:  On physical examination, she is a  pleasant female, lying on the stretcher, in no acute distress.  VITAL SIGNS:  Temperature is  normal at 98.8, blood pressure is 130/81,  pulse is 105.  ABDOMEN:  The abdomen is soft and tender to palpation in the right lower  quadrant.  No peritoneal signs, but could be early peritoneal signs.  She has no rebound tenderness, but is more tender on the right versus  the left.   ASSESSMENT AND PLAN:  Appendicitis.  Other laboratories are reviewed.  She did have some moderate amount of hemoglobin in her urine.  Hemoglobin and hematocrit of 15.2 and 43.  Sodium 136, potassium 3.5,  blood urea nitrogen and creatinine are 12 and 0.71.  The plan is  laparoscopic appendectomy.  Receive intravenous antibiotics in the  operating room.  Likely home tomorrow with adequate pain control.      Lennie Muckle, MD  Electronically Signed     ALA/MEDQ  D:  08/02/2007  T:  08/02/2007  Job:  [id number]

## 2011-02-17 ENCOUNTER — Other Ambulatory Visit (HOSPITAL_COMMUNITY): Payer: Self-pay | Admitting: Obstetrics and Gynecology

## 2011-02-17 DIAGNOSIS — Z1231 Encounter for screening mammogram for malignant neoplasm of breast: Secondary | ICD-10-CM

## 2011-02-19 NOTE — Discharge Summary (Signed)
NAMEJANIQUE, Bonnie Barron                ACCOUNT NO.:  [id number]   MEDICAL RECORD NO.:  [id number]          PATIENT TYPE:  INP   LOCATION:  1525                         FACILITY:  [facility]   PHYSICIAN:  Lennie Muckle, MD      DATE OF BIRTH:  1955/11/22   DATE OF ADMISSION:  08/02/2007  DATE OF DISCHARGE:  08/03/2007                               DISCHARGE SUMMARY   DIAGNOSIS:  Acute appendicitis.   PROCEDURES:  Laparoscopic appendectomy on August 02, 2007.   [name] is a 55 year old female who was seen in the emergency room  and was found to have exam and radiographic imaging consistent with  acute appendicitis.  She was taken to the operating suite on August 02, 2007, where laparoscopic appendectomy was performed.  Postoperatively,  she was able to have her pain controlled with oral narcotics.  She  received one dose postoperatively of Zosyn.  She did not require  postoperative antibiotics at home.  She was able to be discharged  without incident on postoperative day one.  She will follow up in 2-3  weeks' time.  She is given instructions for no heavy lifting greater  than 20 pounds for 6 weeks and shower daily; wash her incisions.  She is  to call if she develops any further problems.      Lennie Muckle, MD  Electronically Signed     ALA/MEDQ  D:  08/07/2007  T:  08/08/2007  Job:  [id number]

## 2011-03-29 ENCOUNTER — Other Ambulatory Visit: Payer: Self-pay | Admitting: Internal Medicine

## 2011-04-14 ENCOUNTER — Ambulatory Visit (HOSPITAL_COMMUNITY): Payer: Self-pay

## 2011-04-20 ENCOUNTER — Ambulatory Visit (HOSPITAL_COMMUNITY): Payer: Managed Care, Other (non HMO)

## 2011-04-21 ENCOUNTER — Encounter: Payer: Managed Care, Other (non HMO) | Admitting: Internal Medicine

## 2011-04-28 ENCOUNTER — Ambulatory Visit (HOSPITAL_COMMUNITY)
Admission: RE | Admit: 2011-04-28 | Discharge: 2011-04-28 | Disposition: A | Discharge status: Home / Self Care | Payer: Managed Care, Other (non HMO) | Source: Ambulatory Visit | Attending: Obstetrics and Gynecology | Admitting: Obstetrics and Gynecology

## 2011-04-28 DIAGNOSIS — Z1231 Encounter for screening mammogram for malignant neoplasm of breast: Secondary | ICD-10-CM

## 2011-07-14 LAB — URINALYSIS, ROUTINE W REFLEX MICROSCOPIC
Bilirubin Urine: NEGATIVE
Nitrite: NEGATIVE
Specific Gravity, Urine: 1.007
Urobilinogen, UA: 0.2

## 2011-07-14 LAB — CBC
MCHC: 35
RBC: 4.81
WBC: 14 — ABNORMAL HIGH

## 2011-07-14 LAB — BASIC METABOLIC PANEL
CO2: 22
Calcium: 9.7
Creatinine, Ser: 0.71
GFR calc non Af Amer: >60
Potassium: 3.5
Sodium: 136

## 2011-07-14 LAB — DIFFERENTIAL
Basophils Relative: 0
Monocytes Relative: 7
Neutro Abs: 11.6 — ABNORMAL HIGH
Neutrophils Relative %: 83 — ABNORMAL HIGH

## 2011-07-14 LAB — URINE MICROSCOPIC-ADD ON

## 2011-08-11 ENCOUNTER — Other Ambulatory Visit: Payer: Self-pay | Admitting: Podiatry

## 2011-08-11 DIAGNOSIS — M79671 Pain in right foot: Secondary | ICD-10-CM

## 2011-08-16 ENCOUNTER — Other Ambulatory Visit: Payer: Managed Care, Other (non HMO)

## 2011-08-18 ENCOUNTER — Other Ambulatory Visit: Payer: Managed Care, Other (non HMO)

## 2011-08-24 ENCOUNTER — Ambulatory Visit
Admission: RE | Admit: 2011-08-24 | Discharge: 2011-08-24 | Disposition: A | Discharge status: Home / Self Care | Payer: Managed Care, Other (non HMO) | Source: Ambulatory Visit | Attending: Podiatry | Admitting: Podiatry

## 2011-08-24 ENCOUNTER — Other Ambulatory Visit: Payer: Managed Care, Other (non HMO)

## 2011-08-24 DIAGNOSIS — M79671 Pain in right foot: Secondary | ICD-10-CM

## 2011-08-25 ENCOUNTER — Other Ambulatory Visit: Payer: Managed Care, Other (non HMO)

## 2012-03-27 ENCOUNTER — Other Ambulatory Visit: Payer: Self-pay | Admitting: Obstetrics and Gynecology

## 2012-03-27 DIAGNOSIS — Z1231 Encounter for screening mammogram for malignant neoplasm of breast: Secondary | ICD-10-CM

## 2012-05-04 ENCOUNTER — Ambulatory Visit (HOSPITAL_COMMUNITY)
Admission: RE | Admit: 2012-05-04 | Discharge: 2012-05-04 | Disposition: A | Discharge status: Home / Self Care | Payer: Managed Care, Other (non HMO) | Source: Ambulatory Visit | Attending: Obstetrics and Gynecology | Admitting: Obstetrics and Gynecology

## 2012-05-04 DIAGNOSIS — Z1231 Encounter for screening mammogram for malignant neoplasm of breast: Secondary | ICD-10-CM | POA: Insufficient documentation

## 2012-06-15 ENCOUNTER — Ambulatory Visit (INDEPENDENT_AMBULATORY_CARE_PROVIDER_SITE_OTHER): Payer: Managed Care, Other (non HMO) | Admitting: Obstetrics and Gynecology

## 2012-06-15 ENCOUNTER — Encounter: Payer: Self-pay | Admitting: Obstetrics and Gynecology

## 2012-06-15 ENCOUNTER — Ambulatory Visit (INDEPENDENT_AMBULATORY_CARE_PROVIDER_SITE_OTHER): Payer: Managed Care, Other (non HMO)

## 2012-06-15 VITALS — BP 100/60 | HR 78 | Resp 14 | Ht 67.0 in | Wt 140.0 lb

## 2012-06-15 DIAGNOSIS — Z124 Encounter for screening for malignant neoplasm of cervix: Secondary | ICD-10-CM

## 2012-06-15 DIAGNOSIS — N83209 Unspecified ovarian cyst, unspecified side: Secondary | ICD-10-CM | POA: Insufficient documentation

## 2012-06-15 DIAGNOSIS — E079 Disorder of thyroid, unspecified: Secondary | ICD-10-CM | POA: Insufficient documentation

## 2012-06-15 DIAGNOSIS — Z01419 Encounter for gynecological examination (general) (routine) without abnormal findings: Secondary | ICD-10-CM

## 2012-06-15 DIAGNOSIS — Z13820 Encounter for screening for osteoporosis: Secondary | ICD-10-CM

## 2012-06-15 DIAGNOSIS — E7800 Pure hypercholesterolemia, unspecified: Secondary | ICD-10-CM | POA: Insufficient documentation

## 2012-06-15 NOTE — Progress Notes (Signed)
The patient is taking hormone replacement therapy: Estrace 1 mg daily / Progesterone cream 1 g BID followed by Dr Lana Fish The patient  is taking a Calcium supplement. Post-menopausal bleeding:no  Last Pap: was normal August  2012 "WNL" Last mammogram: was normal July  2013 Last DEXA scan : per pt last dexa was years ago. Last colonoscopy:normal 2006 per pt was told to return in 10 years.  Urinary symptoms: none Normal bowel movements: Yes Reports abuse at home: No:   Subjective:    Bonnie Barron is a 56 y.o. female G2P2 who presents for annual exam.  The patient has no complaints today.   The following portions of the patient's history were reviewed and updated as appropriate: allergies, current medications, past family history, past medical history, past social history, past surgical history and problem list.  Review of Systems Pertinent items are noted in HPI. Gastrointestinal:No change in bowel habits, no abdominal pain, no rectal bleeding Genitourinary:negative for dysuria, frequency, hematuria, nocturia and urinary incontinence    Objective:     BP 100/60  Pulse 78  Resp 14  Ht 5\' 7"  (1.702 m)  Wt 140 lb (63.504 kg)  BMI 21.93 kg/m2  Weight:  Wt Readings from Last 1 Encounters:  06/15/12 140 lb (63.504 kg)     BMI: Body mass index is 21.93 kg/(m^2). General Appearance: Alert, appropriate appearance for age. No acute distress HEENT: Grossly normal Neck / Thyroid: Supple, no masses, nodes or enlargement Lungs: clear to auscultation bilaterally Back: No CVA tenderness Breast Exam: No masses or nodes.No dimpling, nipple retraction or discharge. Cardiovascular: Regular rate and rhythm. S1, S2, no murmur Gastrointestinal: Soft, non-tender, no masses or organomegaly Pelvic Exam: Vulva and vagina appear normal. Bimanual exam reveals normal uterus and adnexa. Rectovaginal: normal rectal, no masses Lymphatic Exam: Non-palpable nodes in neck, clavicular, axillary, or  inguinal regions Skin: no rash or abnormalities Neurologic: Normal gait and speech, no tremor  Psychiatric: Alert and oriented, appropriate affect.     Assessment:    Normal gyn exam    Plan:   mammogram pap smear return annually or prn Follow-up:  for annual exam DEXA HRT reviewed with [facility] and [facility]. Pt desires to continue. Only 1 year on HRT

## 2012-06-16 LAB — PAP IG W/ RFLX HPV ASCU

## 2012-06-19 ENCOUNTER — Telehealth: Payer: Self-pay

## 2012-06-19 NOTE — Telephone Encounter (Signed)
Pt notified that DXA was mild osteoporosis per SR. Given the option to come in and discuss.  Pt states she may call for appt.  ld

## 2012-11-30 ENCOUNTER — Encounter: Payer: Self-pay | Admitting: Internal Medicine

## 2012-11-30 ENCOUNTER — Ambulatory Visit (INDEPENDENT_AMBULATORY_CARE_PROVIDER_SITE_OTHER): Payer: 59 | Admitting: Internal Medicine

## 2012-11-30 VITALS — BP 122/80 | HR 74 | Wt 139.0 lb

## 2012-11-30 DIAGNOSIS — E079 Disorder of thyroid, unspecified: Secondary | ICD-10-CM

## 2012-11-30 DIAGNOSIS — H8110 Benign paroxysmal vertigo, unspecified ear: Secondary | ICD-10-CM

## 2012-11-30 LAB — CBC WITH DIFFERENTIAL/PLATELET
Basophils Relative: 0.4 % (ref 0.0–3.0)
Eosinophils Relative: 3.6 % (ref 0.0–5.0)
Lymphocytes Relative: 29.5 % (ref 12.0–46.0)
Monocytes Relative: 9.6 % (ref 3.0–12.0)
Neutrophils Relative %: 56.9 % (ref 43.0–77.0)
RBC: 4.72 Mil/uL (ref 3.87–5.11)
WBC: 5.1 10*3/uL (ref 4.5–10.5)

## 2012-11-30 LAB — BASIC METABOLIC PANEL
Calcium: 9.9 mg/dL (ref 8.4–10.5)
Chloride: 103 mEq/L (ref 96–112)
Creatinine, Ser: 0.7 mg/dL (ref 0.4–1.2)

## 2012-11-30 MED ORDER — MECLIZINE HCL 12.5 MG PO TABS: 12.5000 mg | ORAL_TABLET | Freq: Three times a day (TID) | ORAL | Status: DC | PRN

## 2012-11-30 NOTE — Assessment & Plan Note (Signed)
Symptoms likely peripheral, doubt a central issues such as a stroke. For completeness we'll check a CBC and a BMP. See instructions Prescribe Antivert, warned about drowsiness

## 2012-11-30 NOTE — Patient Instructions (Addendum)
Rest, drink plenty of fluids, Antivert as needed for dizziness. Call if no better in few days or if symptoms severe. Call if you have any sort of weakness, facial numbness, double vision. Scheduled your routine checkup at your convenience.

## 2012-11-30 NOTE — Assessment & Plan Note (Signed)
Good compliance with medication, last TSH at her integrative medicine doctor okay. Plan check a TSH

## 2012-11-30 NOTE — Progress Notes (Signed)
  Subjective:    Patient ID: Bonnie Barron, female    DOB: [birth year], 57 y.o.   MRN: [id number]  HPI Acute visit. Last seen 2012 She was feeling great last night, she was at home, she sat down and suddenly become quite dizzy, described as spinning. She had no nausea. Symptoms lasted several hours, she went to bed, not in that symptoms got worse if she moves her head. She's not doing anything different, not taking any new medications, she actually has a very healthy lifestyle. This morning she feels better but not back to normal. She has a history of hypothyroidism, good medication compliance, last TSH 6 months ago at another   office, reportedly good results.  Past Medical History  Diagnosis Date  . Ovarian cyst   . Varicose veins   . Hypothyroidism   . Hypercholesteremia   . Palpitations     palpitations, saw cards 2010, ECHO was told neg    Past Surgical History  Procedure Laterality Date  . Appendectomy    . Wisdom tooth extraction    . Tonsillectomy and adenoidectomy      Social History: Married, husband had a CABG 2 childred tobacco-- no ETOH-- socially exercise-- walks, active, work out    Review of Systems  denies headache, diplopia. No slurred speech or facial numbness. No focalized weakness. Having occasional palpitations, last time was about a month ago, went to see a cardiologist, no EKG done but   was told symptoms were probably related to a URI that she had at the time.     Objective:   Physical Exam  General -- alert, well-developed, BMI 21.   Neck --no thyromegaly , normal carotid pulse  HEENT --  throat w/o redness, face symmetric and not tender to palpation, EOMI, PERRLA  Lungs -- normal respiratory effort, no intercostal retractions, no accessory muscle use, and normal breath sounds.   Heart-- normal rate, regular rhythm, no murmur, and no gallop.   Extremities-- no pretibial edema bilaterally  Neurologic-- alert & oriented X3, face symmetric,  speech gait and motor are intact. DTRs  normal in all extremities. Psych-- Cognition and judgment appear intact. Alert and cooperative with normal attention span and concentration.  not anxious appearing and not depressed appearing.      Assessment & Plan:

## 2012-12-04 ENCOUNTER — Encounter: Payer: Self-pay | Admitting: *Deleted

## 2012-12-29 ENCOUNTER — Encounter: Payer: Self-pay | Admitting: Internal Medicine

## 2012-12-29 ENCOUNTER — Ambulatory Visit (INDEPENDENT_AMBULATORY_CARE_PROVIDER_SITE_OTHER): Payer: 59 | Admitting: Internal Medicine

## 2012-12-29 VITALS — BP 116/78 | HR 76 | Temp 98.1°F | Wt 140.0 lb

## 2012-12-29 DIAGNOSIS — R140 Abdominal distension (gaseous): Secondary | ICD-10-CM

## 2012-12-29 DIAGNOSIS — R143 Flatulence: Secondary | ICD-10-CM

## 2012-12-29 DIAGNOSIS — R319 Hematuria, unspecified: Secondary | ICD-10-CM

## 2012-12-29 DIAGNOSIS — R109 Unspecified abdominal pain: Secondary | ICD-10-CM

## 2012-12-29 DIAGNOSIS — R141 Gas pain: Secondary | ICD-10-CM

## 2012-12-29 DIAGNOSIS — R350 Frequency of micturition: Secondary | ICD-10-CM

## 2012-12-29 LAB — POCT URINALYSIS DIPSTICK
Bilirubin, UA: NEGATIVE
Ketones, UA: NEGATIVE
Leukocytes, UA: NEGATIVE

## 2012-12-29 NOTE — Progress Notes (Signed)
  Subjective:    Patient ID: Bonnie Barron, female    DOB: March 09, 1956, 57 y.o.   MRN: [id number]  HPI Abdominal pain began 1 month ago  R  lower  quadrant ; it was described as pinching and non radiating . Severity was up to a level 5  ; the discomfort lasted 2  Hours but it resolved w/o Rx. The pain has recurred intermittently  & has been more persistent in past 48 hrs.Treatment with OTC urine cleanser from [facility] Practition[facility]  was partially effective  No significant weight change noted but she has felt bloated for a week. Her last Gyn appt was 8/13. Dysuria, pyuria, and hematuria were absent but frequency for 3 weeks. There was no associated rash or radicular pain in the area of the discomfort; but waist flexion seemed to  increase it today.  Past medical history and family history are negative  for   reflux,  colitis,  colon polyps, or colon cancer. There is a history of  ulcers in her father. PMH of ovarian cyst           Review of Systems Nausea, vomiting, constipation , diarrhea, melena or rectal bleeding were not described. There was no associated dyspepsia ,dysphagia, anorexia or hematemesis.Fever,chills, or sweats were not present.  She denies numbness, tingling, or weakness in her legs. She also denies any incontinence of urine or stool.     Objective:   Physical Exam General appearance : thin but in good health and nourishment w/o distress. Eyes: No conjunctival inflammation or scleral icterus is present. Oral exam: Dental hygiene is good; lips and gums are healthy appearing.There is no oropharyngeal erythema or exudate noted.  Heart:  Normal rate and regular rhythm. S1 and S2 normal without gallop, murmur, click, rub or other extra sounds . Lungs:Chest clear to auscultation; no wheezes, rhonchi,rales ,or rubs present.No increased work of breathing.  Abdomen: bowel sounds normal, soft and non-tender without masses, organomegaly or hernias noted.  No guarding or  rebound.  Musculoskeletal: No spinal malalignment. No tenderness to percussion of the flanks. Negative straight leg raising to 90 plus. Strength and tone normal. Able to lie down and sit up without help  Neuro: Deep tendon reflexes equal Rectal : not performed  Skin:Warm & dry.  Intact without suspicious lesions or rashes ; no jaundice or tenting. Lymphatic: No lymphadenopathy is noted about the head, neck, axilla       Assessment & Plan:  #1 atypical right lower quadrant pain. Waist flexion initiation of pain suggest radicular component; but no evidence of such on exam.  #2 bloating in the context of past history of ovarian cyst  #3 frequency with microscopic hematuria  Plan: Urine will be cultured; CBC and differential ordered. If those studies are negative and symptoms persist; ultrasound would be pursued.

## 2012-12-29 NOTE — Patient Instructions (Addendum)
Order for x-rays entered into  the computer; these will be performed at [facility]. You will be notified of appointment date.

## 2012-12-31 LAB — URINE CULTURE: Colony Count: 7000

## 2013-01-02 ENCOUNTER — Ambulatory Visit (HOSPITAL_BASED_OUTPATIENT_CLINIC_OR_DEPARTMENT_OTHER)
Admission: RE | Admit: 2013-01-02 | Discharge: 2013-01-02 | Disposition: A | Discharge status: Home / Self Care | Payer: 59 | Source: Ambulatory Visit | Attending: Internal Medicine | Admitting: Internal Medicine

## 2013-01-02 ENCOUNTER — Ambulatory Visit (HOSPITAL_BASED_OUTPATIENT_CLINIC_OR_DEPARTMENT_OTHER): Admission: RE | Admit: 2013-01-02 | Payer: 59 | Source: Ambulatory Visit

## 2013-01-02 ENCOUNTER — Other Ambulatory Visit: Payer: Self-pay | Admitting: Internal Medicine

## 2013-01-02 DIAGNOSIS — R1031 Right lower quadrant pain: Secondary | ICD-10-CM

## 2013-01-04 ENCOUNTER — Encounter: Payer: Self-pay | Admitting: Gastroenterology

## 2013-01-15 ENCOUNTER — Encounter: Payer: Self-pay | Admitting: Gastroenterology

## 2013-01-15 ENCOUNTER — Ambulatory Visit (INDEPENDENT_AMBULATORY_CARE_PROVIDER_SITE_OTHER): Payer: 59 | Admitting: Gastroenterology

## 2013-01-15 VITALS — BP 106/70 | HR 80 | Ht 66.0 in | Wt 139.2 lb

## 2013-01-15 DIAGNOSIS — R1031 Right lower quadrant pain: Secondary | ICD-10-CM

## 2013-01-15 NOTE — Progress Notes (Signed)
History of Present Illness: This is a 57 year old female who relates mild right groin pain for the past 2 months. Her symptoms can occur at any time but frequently bother her when seated for prolonged period time or with certain movements. Does not have any relationship to any digestive function. Symptoms tend to last for 1-2 hours and then dissipate. She was evaluated by Dr. Alwyn Ren and pelvic ultrasound was unremarkable. She previously underwent colonoscopy 11/2005 which was normal. Denies weight loss, constipation, diarrhea, change in stool caliber, melena, hematochezia, nausea, vomiting, dysphagia, reflux symptoms, chest pain.  Review of Systems: Pertinent positive and negative review of systems were noted in the above HPI section. All other review of systems were otherwise negative.  Current Medications, Allergies, Past Medical History, Past Surgical History, Family History and Social History were reviewed in [employer] record.  Physical Exam: General: Well developed , well nourished, no acute distress Head: Normocephalic and atraumatic Eyes:  sclerae anicteric, EOMI Ears: Normal auditory acuity Mouth: No deformity or lesions Neck: Supple, no masses or thyromegaly Lungs: Clear throughout to auscultation Heart: Regular rate and rhythm; no murmurs, rubs or bruits Abdomen: Soft, minimal tenderness in her right inguinal region without rebound or guarding and non distended. No masses, hepatosplenomegaly or hernias noted. Normal Bowel sounds Musculoskeletal: Symmetrical with no gross deformities  Skin: No lesions on visible extremities Pulses:  Normal pulses noted Extremities: No clubbing, cyanosis, edema or deformities noted Neurological: Alert oriented x 4, grossly nonfocal Cervical Nodes:  No significant cervical adenopathy Inguinal Nodes: No significant inguinal adenopathy Psychological:  Alert and cooperative. Normal mood and affect  Assessment and  Recommendations:  1. RLQ groin/pelvic pain. This does not appear to be gastrointestinal in etiology. It is possible that she has a small hernia I cannot appreciate on physical exam. Schedule abdominal/pelvic CT scan and obtain stool Hemoccults to further evaluate. Attempt to locate records from prior colonoscopy. Consider GYN evaluation.  2. Colorectal cancer screening, average risk. A routine screening colonoscopy is due in February 2017.

## 2013-01-15 NOTE — Patient Instructions (Addendum)
Please follow instructions on Hemoccult cards and mail them back to [place] when finished.   You have been scheduled for a CT scan of the abdomen and pelvis at [facility] ([address]---this is in the same building as Architectural technologist).   You are scheduled on 01/17/13 at 3:00pm. You should arrive 15 minutes prior to your appointment time for registration. Please follow the written instructions below on the day of your exam:  WARNING: IF YOU ARE ALLERGIC TO IODINE/X-RAY DYE, PLEASE NOTIFY RADIOLOGY IMMEDIATELY AT [phone]! YOU WILL BE GIVEN A 13 HOUR PREMEDICATION PREP.  1) Do not eat or drink anything after 11:00am (4 hours prior to your test) 2) You have been given 2 bottles of oral contrast to drink. The solution may taste better if refrigerated, but do NOT add ice or any other liquid to this solution. Shake well before drinking.    Drink 1 bottle of contrast @ 1:00pm (2 hours prior to your exam)  Drink 1 bottle of contrast @ 2:00pm (1 hour prior to your exam)  You may take any medications as prescribed with a small amount of water except for the following: Metformin, Glucophage, Glucovance, Avandamet, Riomet, Fortamet, Actoplus Met, Janumet, Glumetza or Metaglip. The above medications must be held the day of the exam AND 48 hours after the exam.  The purpose of you drinking the oral contrast is to aid in the visualization of your intestinal tract. The contrast solution may cause some diarrhea. Before your exam is started, you will be given a small amount of fluid to drink. Depending on your individual set of symptoms, you may also receive an intravenous injection of x-ray contrast/dye. Plan on being at [facility] for 30 minutes or long, depending on the type of exam you are having performed.  This test typically takes 30-45 minutes to complete.  If you have any questions regarding your exam or if you need to reschedule, you may call the CT department at [phone]  between the hours of 8:00 am and 5:00 pm, Monday-Friday.  ________________________________________________________________________  Thank you for choosing me and [facility].  Venita Lick. Pleas Koch., MD., Clementeen Graham

## 2013-01-17 ENCOUNTER — Ambulatory Visit (INDEPENDENT_AMBULATORY_CARE_PROVIDER_SITE_OTHER)
Admission: RE | Admit: 2013-01-17 | Discharge: 2013-01-17 | Disposition: A | Discharge status: Home / Self Care | Payer: 59 | Source: Ambulatory Visit | Attending: Gastroenterology | Admitting: Gastroenterology

## 2013-01-17 DIAGNOSIS — R1031 Right lower quadrant pain: Secondary | ICD-10-CM

## 2013-01-17 MED ORDER — IOHEXOL 300 MG/ML  SOLN
100.0000 mL | Freq: Once | INTRAMUSCULAR | Status: AC | PRN
  Administered 2013-01-17: 100 mL via INTRAVENOUS

## 2013-01-26 ENCOUNTER — Telehealth: Payer: Self-pay | Admitting: Gastroenterology

## 2013-01-26 NOTE — Telephone Encounter (Signed)
Patient advised that the results are normal

## 2013-01-30 ENCOUNTER — Other Ambulatory Visit (INDEPENDENT_AMBULATORY_CARE_PROVIDER_SITE_OTHER): Payer: 59

## 2013-01-30 DIAGNOSIS — E0590 Thyrotoxicosis, unspecified without thyrotoxic crisis or storm: Secondary | ICD-10-CM

## 2013-01-30 DIAGNOSIS — R1031 Right lower quadrant pain: Secondary | ICD-10-CM

## 2013-01-30 LAB — HEMOCCULT SLIDES (X 3 CARDS)
Fecal Occult Blood: NEGATIVE
OCCULT 5: NEGATIVE

## 2013-02-20 ENCOUNTER — Ambulatory Visit (INDEPENDENT_AMBULATORY_CARE_PROVIDER_SITE_OTHER): Payer: 59 | Admitting: Internal Medicine

## 2013-02-20 ENCOUNTER — Encounter: Payer: Self-pay | Admitting: Internal Medicine

## 2013-02-20 VITALS — BP 112/76 | HR 79 | Temp 98.9°F | Ht 67.0 in | Wt 141.0 lb

## 2013-02-20 DIAGNOSIS — Z23 Encounter for immunization: Secondary | ICD-10-CM

## 2013-02-20 DIAGNOSIS — Z2911 Encounter for prophylactic immunotherapy for respiratory syncytial virus (RSV): Secondary | ICD-10-CM

## 2013-02-20 DIAGNOSIS — Z0000 Encounter for general adult medical examination without abnormal findings: Secondary | ICD-10-CM

## 2013-02-20 DIAGNOSIS — E039 Hypothyroidism, unspecified: Secondary | ICD-10-CM

## 2013-02-20 NOTE — Assessment & Plan Note (Addendum)
Has been off Synthroid for more than 2 years, has followup with a alternative medicine doctor and taking "naturethroid". TSH levels have been stable. Plan: Check TSH every 4 months. Prescribe synthroid if needed.

## 2013-02-20 NOTE — Patient Instructions (Addendum)
Please come back this week for labs: FLP, AST, ALT dx V70 ----- Please  check your thyroid every 4 months. Call for an appointment in 4 months (TSH, hypothyroidism) --- Next visit with me in one year.

## 2013-02-20 NOTE — Progress Notes (Signed)
  Subjective:    Patient ID: Bonnie Barron, female    DOB: [birth year], 57 y.o.   MRN: [id number]  HPI CPX  Past Medical History  Diagnosis Date  . Ovarian cyst   . Varicose veins   . Hypothyroidism   . Hypercholesteremia   . Palpitations     palpitations, saw cards 2010, ECHO was told neg    Past Surgical History  Procedure Laterality Date  . Appendectomy  2010  . Wisdom tooth extraction    . Tonsillectomy and adenoidectomy     History   Social History  . Marital Status: Married    Spouse Name: N/A    Number of Children: 2  . Years of Education: N/A   Occupational History  . homemaker    Social History Main Topics  . Smoking status: Never Smoker   . Smokeless tobacco: Never Used  . Alcohol Use: Yes     Comment: 3 times per week  . Drug Use: No  . Sexually Active: Yes    Birth Control/ Protection: Post-menopausal   Other Topics Concern  . Not on file   Social History Narrative   husband had a CABG, doing well              Family History  Problem Relation Age of Onset  . Heart disease Mother     M and F .late in life  . Stroke Mother     M and F ,late in life  . Hypertension Mother   . Deep vein thrombosis Father   . Ulcers Father   . Colon polyps Brother     40-50  . Breast cancer Neg Hx   . Colon cancer Neg Hx     Review of Systems Diet is healthy most days. Very active, exercises 5 times a week. Had some heart fluttering few weeks ago, symptoms resolved. Had abdominal pain, saw GI, had a CT. Symptoms essentially resolved. No chest pain or shortness or breath No nausea, vomiting, diarrhea blood in the stools. No anxiety- depression.     Objective:   Physical Exam BP 112/76  Pulse 79  Temp(Src) 98.9 F (37.2 C) (Oral)  Ht 5\' 7"  (1.702 m)  Wt 141 lb (63.957 kg)  BMI 22.08 kg/m2  SpO2 98%  General -- alert, well-developed, nad .   Neck --no thyromegaly , normal carotid pulse Lungs -- normal respiratory effort, no intercostal  retractions, no accessory muscle use, and normal breath sounds.   Heart-- normal rate, regular rhythm, no murmur, and no gallop.   Abdomen--soft, non-tender, no distention, no masses, no HSM, no guarding, and no rigidity.   Extremities-- no pretibial edema bilaterally Neurologic-- alert & oriented X3 and strength normal in all extremities. Psych-- Cognition and judgment appear intact. Alert and cooperative with normal attention span and concentration.  not anxious appearing and not depressed appearing.       Assessment & Plan:

## 2013-02-20 NOTE — Assessment & Plan Note (Signed)
cont w/ healthy life style Tdap - today zostavax discussed ,provided  today Cscope (-) 2007, next 2017  Dr Russella Dar female care per gyn, Dr Estanislado Pandy States is update on [facility] On HRT

## 2013-02-21 ENCOUNTER — Encounter: Payer: Self-pay | Admitting: Internal Medicine

## 2013-03-16 ENCOUNTER — Other Ambulatory Visit (INDEPENDENT_AMBULATORY_CARE_PROVIDER_SITE_OTHER): Payer: 59

## 2013-03-16 DIAGNOSIS — Z0000 Encounter for general adult medical examination without abnormal findings: Secondary | ICD-10-CM

## 2013-03-16 LAB — LIPID PANEL
Cholesterol: 223 mg/dL — ABNORMAL HIGH (ref 0–200)
HDL: 69.2 mg/dL
Total CHOL/HDL Ratio: 3
Triglycerides: 118 mg/dL (ref 0.0–149.0)
VLDL: 23.6 mg/dL (ref 0.0–40.0)

## 2013-03-16 LAB — CBC WITH DIFFERENTIAL/PLATELET
Basophils Absolute: 0 K/uL (ref 0.0–0.1)
Basophils Relative: 0.7 % (ref 0.0–3.0)
Eosinophils Absolute: 0.3 K/uL (ref 0.0–0.7)
Eosinophils Relative: 6.8 % — ABNORMAL HIGH (ref 0.0–5.0)
HCT: 42.3 % (ref 36.0–46.0)
Hemoglobin: 14.4 g/dL (ref 12.0–15.0)
Lymphocytes Relative: 31.8 % (ref 12.0–46.0)
Lymphs Abs: 1.5 K/uL (ref 0.7–4.0)
MCHC: 34.2 g/dL (ref 30.0–36.0)
MCV: 91.7 fl (ref 78.0–100.0)
Monocytes Absolute: 0.4 K/uL (ref 0.1–1.0)
Monocytes Relative: 9.4 % (ref 3.0–12.0)
Neutro Abs: 2.3 K/uL (ref 1.4–7.7)
Neutrophils Relative %: 51.3 % (ref 43.0–77.0)
Platelets: 240 K/uL (ref 150.0–400.0)
RBC: 4.61 Mil/uL (ref 3.87–5.11)
RDW: 13.8 % (ref 11.5–14.6)
WBC: 4.6 K/uL (ref 4.5–10.5)

## 2013-03-16 LAB — ALT: ALT: 22 U/L (ref 0–35)

## 2013-03-16 LAB — LDL CHOLESTEROL, DIRECT: Direct LDL: 137.3 mg/dL

## 2013-04-05 ENCOUNTER — Other Ambulatory Visit: Payer: Self-pay | Admitting: Obstetrics and Gynecology

## 2013-04-05 DIAGNOSIS — Z1231 Encounter for screening mammogram for malignant neoplasm of breast: Secondary | ICD-10-CM

## 2013-05-08 ENCOUNTER — Ambulatory Visit (HOSPITAL_COMMUNITY): Payer: 59

## 2013-05-18 ENCOUNTER — Ambulatory Visit (HOSPITAL_COMMUNITY): Payer: 59

## 2013-05-21 ENCOUNTER — Ambulatory Visit (HOSPITAL_COMMUNITY)
Admission: RE | Admit: 2013-05-21 | Discharge: 2013-05-21 | Disposition: A | Discharge status: Home / Self Care | Payer: 59 | Source: Ambulatory Visit | Attending: Obstetrics and Gynecology | Admitting: Obstetrics and Gynecology

## 2013-05-21 DIAGNOSIS — Z1231 Encounter for screening mammogram for malignant neoplasm of breast: Secondary | ICD-10-CM | POA: Insufficient documentation

## 2013-06-24 ENCOUNTER — Encounter: Payer: Self-pay | Admitting: Internal Medicine

## 2013-08-09 ENCOUNTER — Other Ambulatory Visit: Payer: Self-pay

## 2013-11-20 ENCOUNTER — Ambulatory Visit: Payer: Self-pay | Admitting: Podiatry

## 2013-11-20 ENCOUNTER — Other Ambulatory Visit: Payer: Self-pay | Admitting: Obstetrics and Gynecology

## 2013-11-27 ENCOUNTER — Ambulatory Visit: Payer: Self-pay | Admitting: Podiatry

## 2013-12-11 ENCOUNTER — Ambulatory Visit (INDEPENDENT_AMBULATORY_CARE_PROVIDER_SITE_OTHER): Payer: BC Managed Care – PPO | Admitting: Internal Medicine

## 2013-12-11 ENCOUNTER — Encounter: Payer: Self-pay | Admitting: Internal Medicine

## 2013-12-11 ENCOUNTER — Ambulatory Visit: Payer: Self-pay | Admitting: Podiatry

## 2013-12-11 VITALS — BP 118/72 | HR 90 | Temp 98.8°F | Wt 145.0 lb

## 2013-12-11 DIAGNOSIS — E039 Hypothyroidism, unspecified: Secondary | ICD-10-CM

## 2013-12-11 DIAGNOSIS — J069 Acute upper respiratory infection, unspecified: Secondary | ICD-10-CM

## 2013-12-11 MED ORDER — GUAIFENESIN-CODEINE 100-10 MG/5ML PO SOLN: 5.0000 mL | Freq: Two times a day (BID) | ORAL | Status: DC | PRN

## 2013-12-11 MED ORDER — AZITHROMYCIN 250 MG PO TABS: ORAL_TABLET | ORAL | Status: DC

## 2013-12-11 NOTE — Patient Instructions (Signed)
Rest, fluids , tylenol For cough, take Mucinex DM twice a day as needed  If the cough continue, take codeine as prescribed, will cause drowsiness  For congestion use OTC Nasocort: 2 nasal sprays on each side of the nose daily until you feel better  Take the antibiotic as prescribed  (zithromax ) Call if no better in few days Call anytime if the symptoms are severe

## 2013-12-11 NOTE — Progress Notes (Signed)
Subjective:    Patient ID: Bonnie Barron, female    DOB: [birth year], 58 y.o.   MRN: [id number]  DOS:  12/11/2013 Type of  visit: Acute visit  Symptoms started a week ago with hoarseness, mucus accumulation in the throat, persistent dry cough. Yesterday, she developed nasal pain and congestion and saw some bloody nasal discharge. Taking a number of different OTCs, yesterday she took codeine x 2  from her husband and it did help the cough.   ROS No fever or chills Mild nausea with episodes of cough. No vomiting, diarrhea. No chest pain or difficulty breathing  Past Medical History  Diagnosis Date  . Ovarian cyst   . Varicose veins   . Hypothyroidism   . Hypercholesteremia   . Palpitations     palpitations, saw cards 2010, ECHO was told neg     Past Surgical History  Procedure Laterality Date  . Appendectomy  2010  . Wisdom tooth extraction    . Tonsillectomy and adenoidectomy      History   Social History  . Marital Status: Married    Spouse Name: N/A    Number of Children: 2  . Years of Education: N/A   Occupational History  . homemaker    Social History Main Topics  . Smoking status: Never Smoker   . Smokeless tobacco: Never Used  . Alcohol Use: Yes     Comment: 3 times per week  . Drug Use: No  . Sexual Activity: Yes    Birth Control/ Protection: Post-menopausal   Other Topics Concern  . Not on file   Social History Narrative   husband had a CABG, doing well                   Medication List       This list is accurate as of: 12/11/13  7:44 PM.  Always use your most recent med list.               AMBULATORY NON FORMULARY MEDICATION  - Nature Thyroid .75 grain  - Take 1 tablet by mouth twice a day     azithromycin 250 MG tablet  Commonly known as:  ZITHROMAX Z-PAK  As directed     Co Q 10 100 MG Caps  Take 1 capsule by mouth daily.     DHEA PO  Take 1 tablet by mouth daily. 15mg      estradiol 1 MG tablet  Commonly known as:   ESTRACE  Take 1 mg by mouth daily.     fish oil-omega-3 fatty acids 1000 MG capsule  Take 2 g by mouth daily.     guaiFENesin-codeine 100-10 MG/5ML syrup  Take 5 mLs by mouth 2 (two) times daily as needed for cough.     levothyroxine 50 MCG tablet  Commonly known as:  SYNTHROID, LEVOTHROID     multivitamin tablet  Take 1 tablet by mouth daily.     NIACIN PO  Take 1 tablet by mouth daily. 640mg      NON FORMULARY  2 tablets daily. Nature thyroid 1G     PROBIOTIC DAILY PO  Take 1 tablet by mouth daily.     progesterone 100 MG capsule  Commonly known as:  PROMETRIUM     Vitamin D 2000 UNITS Caps  Take 1 capsule by mouth daily.           Objective:   Physical Exam BP 118/72  Pulse 90  Temp(Src) 98.8 F (  37.1 C)  Wt 145 lb (65.772 kg)  SpO2 94% General -- alert, well-developed, NAD.  HEENT-- Not pale. TMs Lnormal, R obscured by wax, no d/c; throat symmetric, no redness or discharge. Face symmetric, sinuses not tender to palpation. Nose quite congested.  Lungs -- normal respiratory effort, no intercostal retractions, no accessory muscle use, and normal breath sounds.  Heart-- normal rate, regular rhythm, no murmur.   Extremities-- no pretibial edema bilaterally  Neurologic--  alert & oriented X3. Speech normal, gait normal, strength normal in all extremities.   EOMI  Psych-- Cognition and judgment appear intact. Cooperative with normal attention span and concentration. No anxious or depressed appearing.      Assessment & Plan:  URI, See instructions

## 2013-12-11 NOTE — Assessment & Plan Note (Signed)
Followup by another provider per pt

## 2013-12-11 NOTE — Progress Notes (Signed)
Pre visit review using our clinic review tool, if applicable. No additional management support is needed unless otherwise documented below in the visit note. 

## 2014-01-17 ENCOUNTER — Ambulatory Visit (INDEPENDENT_AMBULATORY_CARE_PROVIDER_SITE_OTHER): Payer: BC Managed Care – PPO

## 2014-01-17 ENCOUNTER — Encounter: Payer: Self-pay | Admitting: Podiatry

## 2014-01-17 ENCOUNTER — Ambulatory Visit (INDEPENDENT_AMBULATORY_CARE_PROVIDER_SITE_OTHER): Payer: BC Managed Care – PPO | Admitting: Podiatry

## 2014-01-17 VITALS — BP 129/75 | HR 67 | Resp 16 | Ht 67.0 in | Wt 142.0 lb

## 2014-01-17 DIAGNOSIS — M778 Other enthesopathies, not elsewhere classified: Secondary | ICD-10-CM

## 2014-01-17 DIAGNOSIS — M7750 Other enthesopathy of unspecified foot: Secondary | ICD-10-CM

## 2014-01-17 DIAGNOSIS — M722 Plantar fascial fibromatosis: Secondary | ICD-10-CM

## 2014-01-17 DIAGNOSIS — M779 Enthesopathy, unspecified: Principal | ICD-10-CM

## 2014-01-17 DIAGNOSIS — M76829 Posterior tibial tendinitis, unspecified leg: Secondary | ICD-10-CM

## 2014-01-17 MED ORDER — METHYLPREDNISOLONE (PAK) 4 MG PO TABS: ORAL_TABLET | ORAL | Status: DC

## 2014-01-17 NOTE — Progress Notes (Signed)
   Subjective:    Patient ID: Bonnie Barron, female    DOB: [birth year], 58 y.o.   MRN: [id number]  HPI Comments: The right foot , having pain with it. Its the whole foot , its been ever since i sprained my ankle [age] thirteen years ago. It bothers me for a while than it goes away . It comes an goes , saw dr Milinda Pointer last for this in 2013  Foot Pain      Review of Systems     Objective:   Physical Exam: I have reviewed her past medical history medications allergies surgeries social history. Pulses are palpable bilateral. Neurologic sensorium is intact. Orthopedic evaluation demonstrates all joints distal to the ankle have a full range of motion without crepitus. She does have pain on palpation to the medial calcaneal tubercle of the right heel. She also has pain on palpation to the posterior tibial tendon as it courses beneath the medial malleolus extending to the level of the navicular tuberosity. She also has pain on end range of motion of the second metatarsophalangeal joint. Radiographic evaluation demonstrates extremely long second metatarsal with resultant hammertoe deformity. She also has soft tissue increase in density at the plantar fascial calcaneal insertion site indicative of plantar fasciitis.        Assessment & Plan:  Assessment: Plantar fasciitis with posterior tibial tendinitis. 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  Plan: Injected the right heel today. Sterapred Dosepak was dispensed. Plantar fascial brace was dispensed a night splint was dispensed both oral and written home-going instructions were provided she also received an injection at the point of maximal tenderness with Kenalog and local anesthetic. I will followup with her in one month.

## 2014-02-19 ENCOUNTER — Ambulatory Visit: Payer: BC Managed Care – PPO | Admitting: Podiatry

## 2014-04-16 ENCOUNTER — Ambulatory Visit (INDEPENDENT_AMBULATORY_CARE_PROVIDER_SITE_OTHER): Payer: BC Managed Care – PPO | Admitting: Internal Medicine

## 2014-04-16 ENCOUNTER — Encounter: Payer: Self-pay | Admitting: Internal Medicine

## 2014-04-16 VITALS — BP 124/75 | HR 89 | Temp 98.2°F | Ht 67.1 in | Wt 144.0 lb

## 2014-04-16 DIAGNOSIS — Z0000 Encounter for general adult medical examination without abnormal findings: Secondary | ICD-10-CM

## 2014-04-16 DIAGNOSIS — E039 Hypothyroidism, unspecified: Secondary | ICD-10-CM

## 2014-04-16 NOTE — Progress Notes (Signed)
Pre visit review using our clinic review tool, if applicable. No additional management support is needed unless otherwise documented below in the visit note. 

## 2014-04-16 NOTE — Assessment & Plan Note (Signed)
Followup elsewhere, currently taking Synthroid and a natural thyroid pill. We agreed on checking her TFTs.

## 2014-04-16 NOTE — Progress Notes (Signed)
Subjective:    Patient ID: Bonnie Barron, female    DOB: Feb 04, 1956, 58 y.o.   MRN: [id number]  DOS:  04/16/2014 Type of visit - description: CPX   ROS Diet, Exercise-- doing well   No  CP, SOB Denies  nausea, vomiting diarrhea, blood in the stools (-) cough, sputum production (-) wheezing, chest congestion No dysuria, gross hematuria, difficulty urinating  No headaches  Denies dizziness      Past Medical History  Diagnosis Date  . Ovarian cyst   . Varicose veins   . Hypothyroidism   . Hypercholesteremia   . Palpitations     palpitations, saw cards 2010, ECHO was told neg     Past Surgical History  Procedure Laterality Date  . Appendectomy  2010  . Wisdom tooth extraction    . Tonsillectomy and adenoidectomy      History   Social History  . Marital Status: Married    Spouse Name: N/A    Number of Children: 2  . Years of Education: N/A   Occupational History  . homemaker    Social History Main Topics  . Smoking status: Never Smoker   . Smokeless tobacco: Never Used  . Alcohol Use: Yes     Comment: 3 times per week  . Drug Use: No  . Sexual Activity: Yes    Birth Control/ Protection: Post-menopausal   Other Topics Concern  . Not on file   Social History Narrative   husband just dx w/ prostate ca                  Family History  Problem Relation Age of Onset  . Heart disease Mother     M and F .late in life  . Stroke Mother     M and F ,late in life  . Hypertension Mother   . Deep vein thrombosis Father   . Ulcers Father   . Colon polyps Brother     40-50  . Breast cancer Neg Hx   . Colon cancer Neg Hx        Medication List       This list is accurate as of: 04/16/14 11:59 PM.  Always use your most recent med list.               AMBULATORY NON FORMULARY MEDICATION  - Nature Thyroid .75 grain  - Take 1 tablet by mouth twice a day     ANGELIQ 0.5-1 MG per tablet  Generic drug:  drospirenone-estradiol     Co Q 10 100 MG  Caps  Take 1 capsule by mouth daily.     DHEA PO  Take 1 tablet by mouth daily. 15mg      fish oil-omega-3 fatty acids 1000 MG capsule  Take 2 g by mouth daily.     levothyroxine 50 MCG tablet  Commonly known as:  SYNTHROID, LEVOTHROID     multivitamin tablet  Take 1 tablet by mouth daily.     PROBIOTIC DAILY PO  Take 1 tablet by mouth daily.     Vitamin D 2000 UNITS Caps  Take 1 capsule by mouth daily.           Objective:   Physical Exam BP 124/75  Pulse 89  Temp(Src) 98.2 F (36.8 C)  Ht 5' 7.1" (1.704 m)  Wt 144 lb (65.318 kg)  BMI 22.50 kg/m2  SpO2 98% General -- alert, well-developed, NAD.  Neck --no thyromegaly , normal carotid  pulse  HEENT-- Not pale.   Lungs -- normal respiratory effort, no intercostal retractions, no accessory muscle use, and normal breath sounds.  Heart-- normal rate, regular rhythm, no murmur.  Abdomen-- Not distended, good bowel sounds,soft, non-tender.  Extremities-- no pretibial edema bilaterally  Neurologic--  alert & oriented X3. Speech normal, gait appropriate for age, strength symmetric and appropriate for age.  Psych-- Cognition and judgment appear intact. Cooperative with normal attention span and concentration. No anxious or depressed appearing.        Assessment & Plan:

## 2014-04-16 NOTE — Assessment & Plan Note (Addendum)
Tdap - 2014 zostavax 2014 Cscope (-) 2007, next 2017  Dr Fuller Plan female care per gyn, Dr Cletis Media,  [facility] Continue healthy lifestyle All previous labs reviewed, will schedule labs, see instructions. Recently husband  diagnosed with prostate cancer, patient is counseled, apparently he has a very good prognosis, recommend to call me anytime if she needs some support

## 2014-04-16 NOTE — Patient Instructions (Signed)
Please come back fasting: FLP, CMP, CBC, TSH, free T3, free T4  --- dx V70   Next visit is for a physical exam in 1 year,  fasting Please make an appointment

## 2014-04-18 ENCOUNTER — Other Ambulatory Visit (INDEPENDENT_AMBULATORY_CARE_PROVIDER_SITE_OTHER): Payer: BC Managed Care – PPO

## 2014-04-18 DIAGNOSIS — E039 Hypothyroidism, unspecified: Secondary | ICD-10-CM

## 2014-04-18 DIAGNOSIS — Z0000 Encounter for general adult medical examination without abnormal findings: Secondary | ICD-10-CM

## 2014-04-18 LAB — CBC WITH DIFFERENTIAL/PLATELET
Basophils Absolute: 0 10*3/uL (ref 0.0–0.1)
Basophils Relative: 0.5 % (ref 0.0–3.0)
EOS PCT: 5.2 % — AB (ref 0.0–5.0)
Eosinophils Absolute: 0.2 10*3/uL (ref 0.0–0.7)
HCT: 41.8 % (ref 36.0–46.0)
Hemoglobin: 13.9 g/dL (ref 12.0–15.0)
LYMPHS PCT: 32.8 % (ref 12.0–46.0)
Lymphs Abs: 1.4 10*3/uL (ref 0.7–4.0)
MCHC: 33.2 g/dL (ref 30.0–36.0)
MCV: 92.6 fl (ref 78.0–100.0)
MONOS PCT: 11 % (ref 3.0–12.0)
Monocytes Absolute: 0.5 10*3/uL (ref 0.1–1.0)
NEUTROS PCT: 50.5 % (ref 43.0–77.0)
Neutro Abs: 2.2 10*3/uL (ref 1.4–7.7)
PLATELETS: 271 10*3/uL (ref 150.0–400.0)
RBC: 4.52 Mil/uL (ref 3.87–5.11)
RDW: 13.6 % (ref 11.5–15.5)
WBC: 4.4 10*3/uL (ref 4.0–10.5)

## 2014-04-18 LAB — LIPID PANEL
CHOLESTEROL: 233 mg/dL — AB (ref 0–200)
HDL: 76.6 mg/dL (ref >39.00)
LDL Cholesterol: 131 mg/dL — ABNORMAL HIGH (ref 0–99)
NonHDL: 156.4
TRIGLYCERIDES: 127 mg/dL (ref 0.0–149.0)
Total CHOL/HDL Ratio: 3
VLDL: 25.4 mg/dL (ref 0.0–40.0)

## 2014-04-18 LAB — COMPREHENSIVE METABOLIC PANEL
ALBUMIN: 4.3 g/dL (ref 3.5–5.2)
ALT: 29 U/L (ref 0–35)
AST: 28 U/L (ref 0–37)
Alkaline Phosphatase: 44 U/L (ref 39–117)
BUN: 15 mg/dL (ref 6–23)
CO2: 31 meq/L (ref 19–32)
Calcium: 9.3 mg/dL (ref 8.4–10.5)
Chloride: 104 mEq/L (ref 96–112)
Creatinine, Ser: 0.7 mg/dL (ref 0.4–1.2)
GFR: 89.54 mL/min (ref >60.00)
GLUCOSE: 102 mg/dL — AB (ref 70–99)
POTASSIUM: 3.6 meq/L (ref 3.5–5.1)
SODIUM: 138 meq/L (ref 135–145)
TOTAL PROTEIN: 6.9 g/dL (ref 6.0–8.3)
Total Bilirubin: 0.5 mg/dL (ref 0.2–1.2)

## 2014-04-18 LAB — T3, FREE: T3 FREE: 3 pg/mL (ref 2.3–4.2)

## 2014-04-18 LAB — T4, FREE: FREE T4: 0.81 ng/dL (ref 0.60–1.60)

## 2014-04-18 LAB — TSH: TSH: 0.3 u[IU]/mL — AB (ref 0.35–4.50)

## 2014-05-13 ENCOUNTER — Other Ambulatory Visit (HOSPITAL_COMMUNITY): Payer: Self-pay | Admitting: Obstetrics and Gynecology

## 2014-05-13 DIAGNOSIS — Z1231 Encounter for screening mammogram for malignant neoplasm of breast: Secondary | ICD-10-CM

## 2014-05-20 IMAGING — MG MM DIGITAL SCREENING BILAT
4 series · 4 of 4 positions shown · non-contrast
Comparison: Previous exams

CLINICAL DATA: Screening.

DIGITAL SCREENING MAMMOGRAM WITH CAD

[R CC]
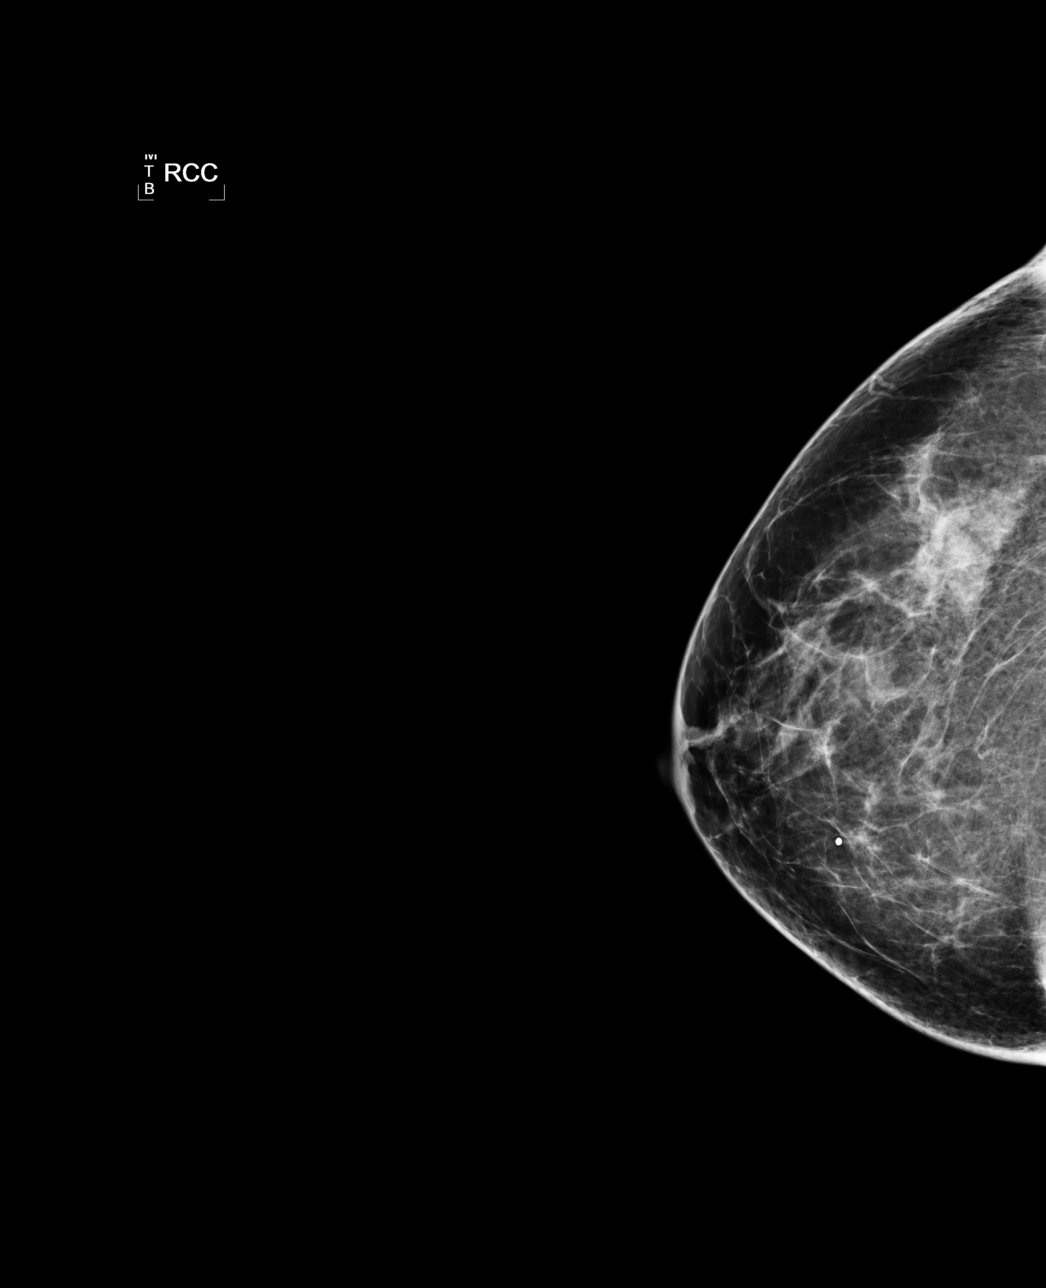

[R MLO]
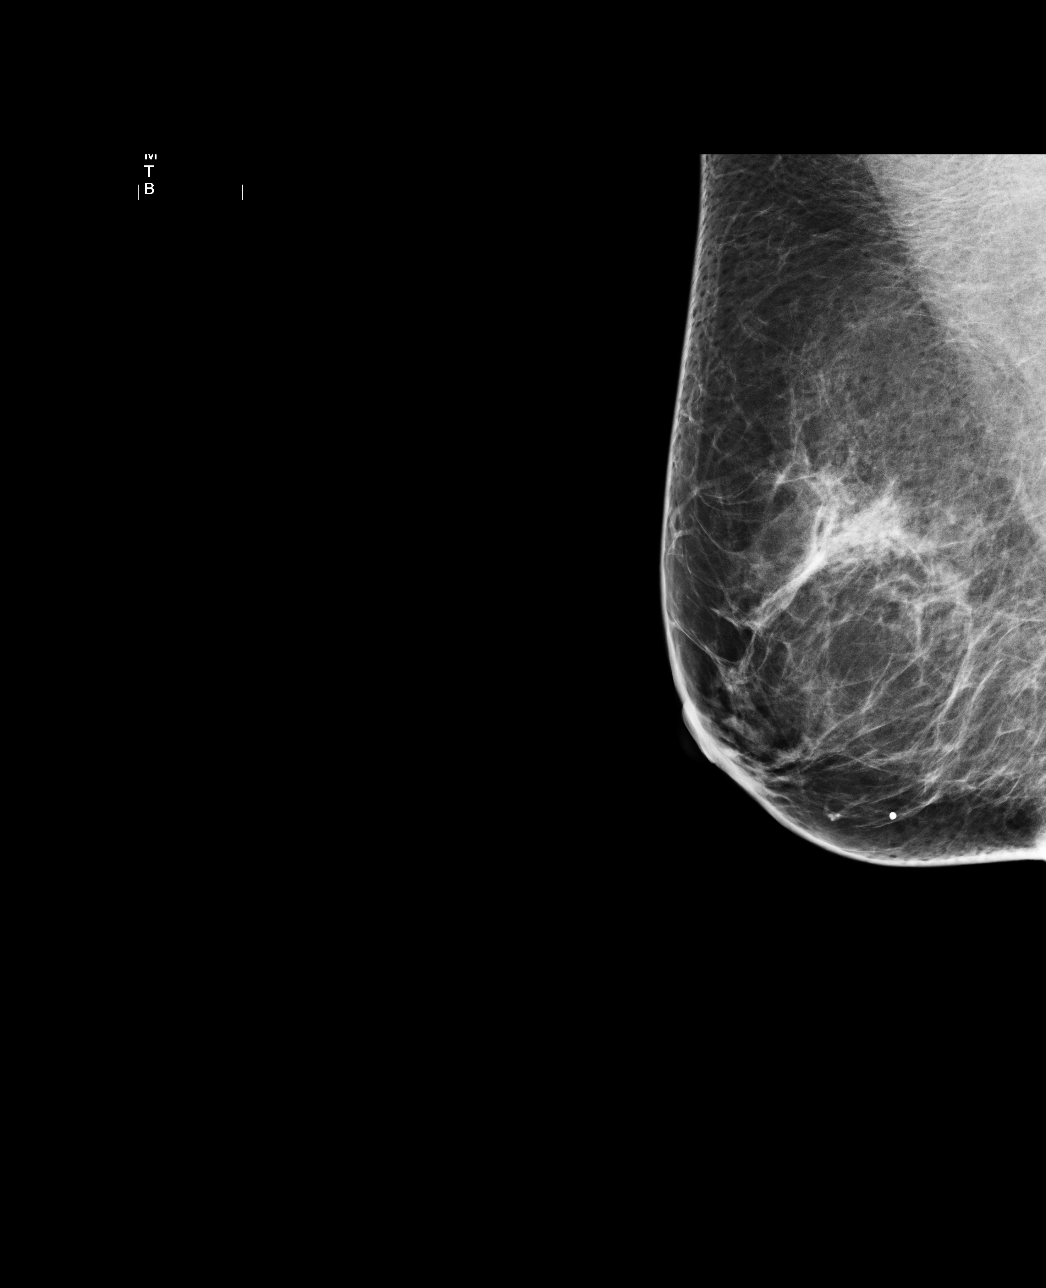

[L CC]
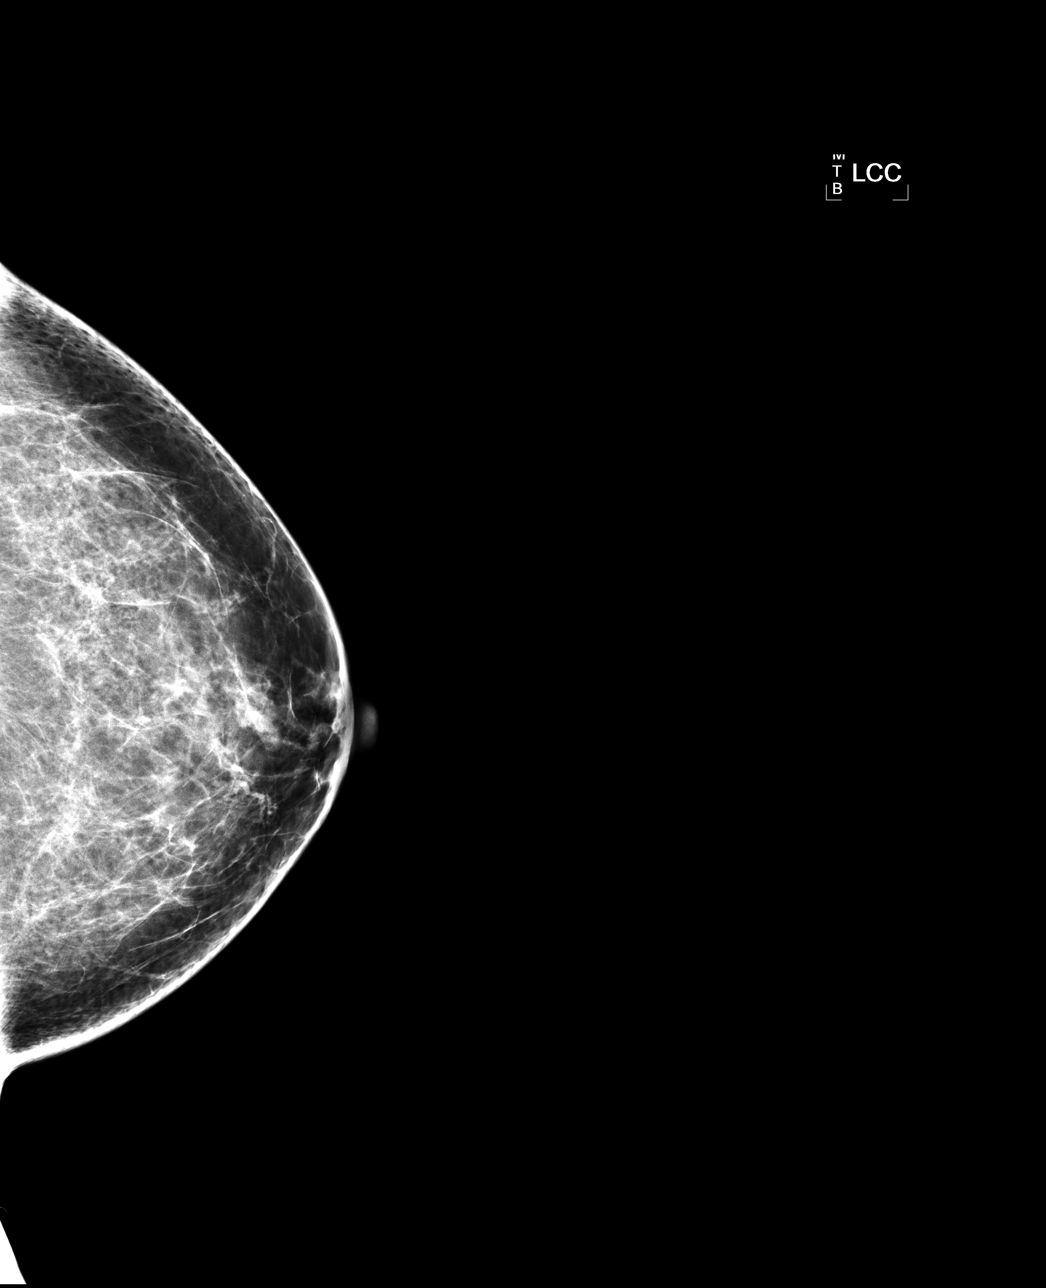

[L MLO]
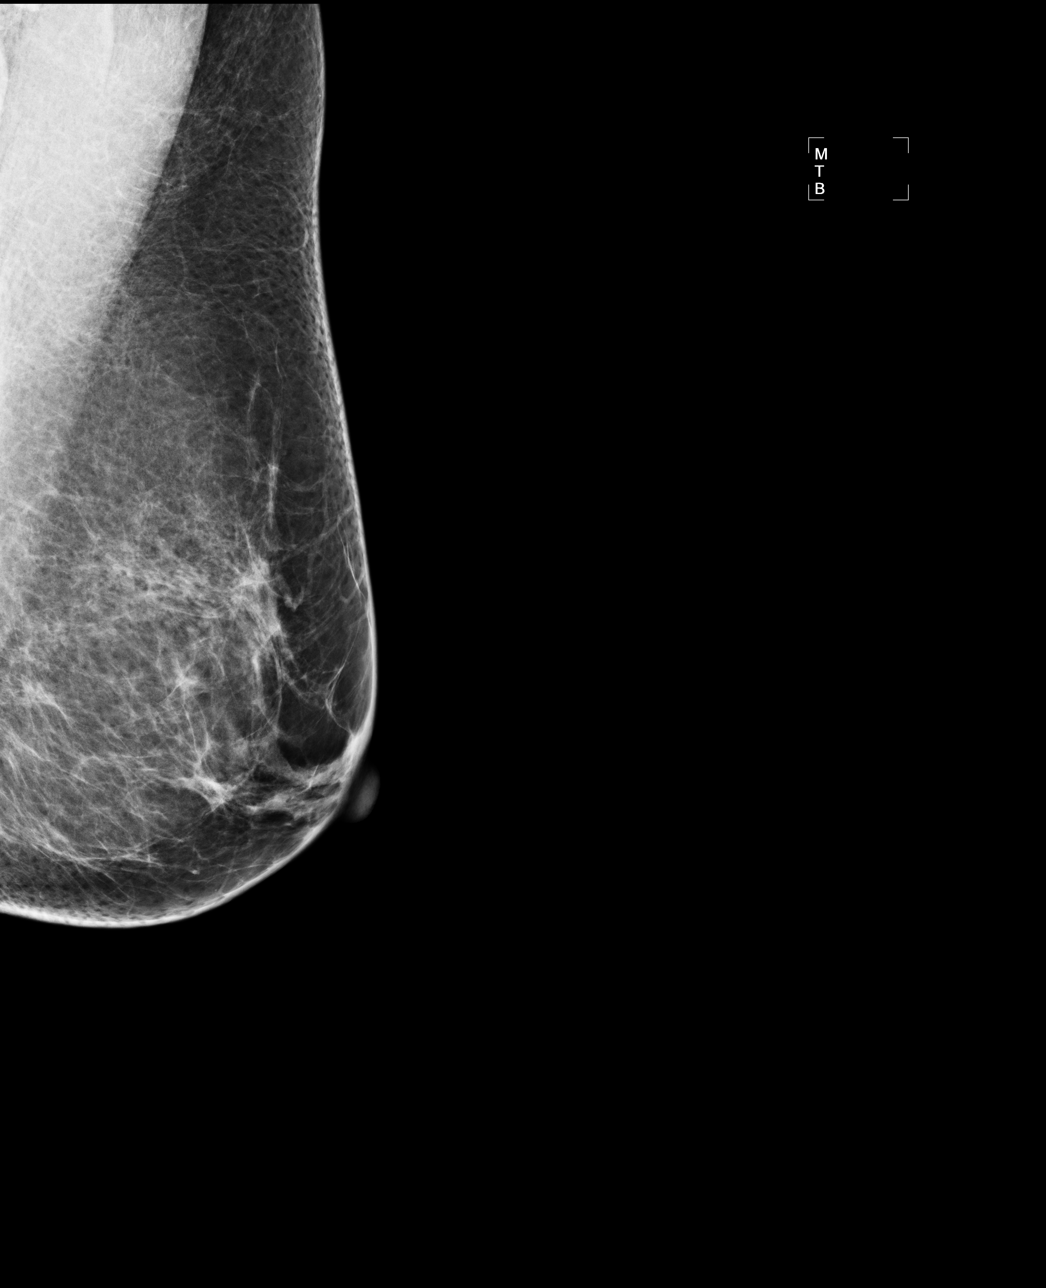

[4 of 4 positions shown; findings below may reference images not displayed]

FINDINGS: There are scattered fibroglandular densities. No
suspicious masses, architectural distortion, or calcifications are
present.

Images were processed with CAD.
IMPRESSION: No mammographic evidence of malignancy.

A result letter of this screening mammogram will be mailed directly
to the patient.

RECOMMENDATION:
Screening mammogram in one year. (Code:N2-C-K05)

BI-RADS CATEGORY 1:  Negative

## 2014-05-29 ENCOUNTER — Ambulatory Visit (HOSPITAL_COMMUNITY)
Admission: RE | Admit: 2014-05-29 | Discharge: 2014-05-29 | Disposition: A | Discharge status: Home / Self Care | Payer: BC Managed Care – PPO | Source: Ambulatory Visit | Attending: Obstetrics and Gynecology | Admitting: Obstetrics and Gynecology

## 2014-05-29 DIAGNOSIS — Z1231 Encounter for screening mammogram for malignant neoplasm of breast: Secondary | ICD-10-CM | POA: Diagnosis not present

## 2014-06-26 LAB — HM DEXA SCAN

## 2014-08-05 ENCOUNTER — Encounter: Payer: Self-pay | Admitting: Internal Medicine

## 2014-10-22 ENCOUNTER — Ambulatory Visit: Payer: Self-pay | Admitting: Podiatry

## 2014-10-23 ENCOUNTER — Encounter: Payer: Self-pay | Admitting: Internal Medicine

## 2014-10-23 ENCOUNTER — Ambulatory Visit (INDEPENDENT_AMBULATORY_CARE_PROVIDER_SITE_OTHER): Payer: BLUE CROSS/BLUE SHIELD | Admitting: Internal Medicine

## 2014-10-23 VITALS — BP 133/80 | HR 77 | Temp 98.0°F | Ht 67.0 in | Wt 144.0 lb

## 2014-10-23 DIAGNOSIS — G2581 Restless legs syndrome: Secondary | ICD-10-CM

## 2014-10-23 LAB — CK: CK TOTAL: 126 U/L (ref 7–177)

## 2014-10-23 LAB — IRON: IRON: 97 ug/dL (ref 42–145)

## 2014-10-23 LAB — VITAMIN B12: VITAMIN B 12: 588 pg/mL (ref 211–911)

## 2014-10-23 LAB — VITAMIN D 25 HYDROXY (VIT D DEFICIENCY, FRACTURES): VITD: 80.28 ng/mL (ref 30.00–100.00)

## 2014-10-23 LAB — SEDIMENTATION RATE: SED RATE: 14 mm/h (ref 0–22)

## 2014-10-23 LAB — FOLATE: Folate: >24.6 ng/mL (ref >5.9)

## 2014-10-23 MED ORDER — PREGABALIN 75 MG PO CAPS: 75.0000 mg | ORAL_CAPSULE | Freq: Every day | ORAL | Status: DC

## 2014-10-23 NOTE — Patient Instructions (Signed)
Get your blood work before you leave    Start taking Lyrica, we may increase the dose if necessary  Let me know if  not improving in the next 2 or 3 weeks     Restless Legs Syndrome Restless legs syndrome is a movement disorder. It may also be called a sensorimotor disorder.  CAUSES  No one knows what specifically causes restless legs syndrome, but it tends to run in families. It is also more common in people with low iron, in pregnancy, in people who need dialysis, and those with nerve damage (neuropathy).Some medications may make restless legs syndrome worse.Those medications include drugs to treat high blood pressure, some heart conditions, nausea, colds, allergies, and depression. SYMPTOMS Symptoms include uncomfortable sensations in the legs. These leg sensations are worse during periods of inactivity or rest. They are also worse while sitting or lying down. Individuals that have the disorder describe sensations in the legs that feel like:  Pulling.  Drawing.  Crawling.  Worming.  Boring.  Tingling.  Pins and needles.  Prickling.  Pain. The sensations are usually accompanied by an overwhelming urge to move the legs. Sudden muscle jerks may also occur. Movement provides temporary relief from the discomfort. In rare cases, the arms may also be affected. Symptoms may interfere with going to sleep (sleep onset insomnia). Restless legs syndrome may also be related to periodic limb movement disorder (PLMD). PLMD is another more common motor disorder. It also causes interrupted sleep. The symptoms from PLMD usually occur most often when you are awake. TREATMENT  Treatment for restless legs syndrome is symptomatic. This means that the symptoms are treated.   Massage and cold compresses may provide temporary relief.  Walk, stretch, or take a cold or hot bath.  Get regular exercise and a good night's sleep.  Avoid caffeine, alcohol, nicotine, and medications that can make it  worse.  Do activities that provide mental stimulation like discussions, needlework, and video games. These may be helpful if you are not able to walk or stretch. Some medications are effective in relieving the symptoms. However, many of these medications have side effects. Ask your caregiver about medications that may help your symptoms. Correcting iron deficiency may improve symptoms for some patients. Document Released: 09/10/2002 Document Revised: 02/04/2014 Document Reviewed: 12/17/2010 [facility] Patient Information 2015 [place], [place]. This information is not intended to replace advice given to you by your health care provider. Make sure you discuss any questions you have with your health care provider.

## 2014-10-23 NOTE — Assessment & Plan Note (Addendum)
One-year history of achiness of the lower extremities, no claudication, vascular exam is normal, denies back pain. Suspect RLS on clinical grounds. Plan: Sedimentation rate, vitamin D, B12, CK and iron Try Lyrica, consider also iron supplements OTC See instructions If not better consider increase Lyrica dose, changed to another medication or investigate for other etiologies such as neuropathy or spinal stenosis. Pt is somewhat reluctant to take a rx medication, iron OTC is an alternative

## 2014-10-23 NOTE — Progress Notes (Signed)
Subjective:    Patient ID: Bonnie Barron, female    DOB: [birth year], 59 y.o.   MRN: [id number]  DOS:  10/23/2014 Type of visit - description : acute Interval history: More than 1 year history of leg pain described as aches, not cramps, worse at night, worse if she sits for a while. Pain decrease when she   stretchs her legs and when she takes a walk after being inactive x a while. Again sx feel like  muscle aches,  joints are okay; if she is very active during the day, at night symptoms are more noticeable. She discussed this with her endocrinologist and gynecologist, HRT was discontinue, that didn't help much. She was suggested OTC Mg and potassium but that didn't help either. No claudication per se.  ROS Denies fever chills or weight loss No rash anywhere No nausea, vomiting, diarrhea No back pain per se, no joint pains. Denies any hand or wrist stiffness, redness or swelling. No headache or dizziness  Past Medical History  Diagnosis Date  . Ovarian cyst   . Varicose veins   . Hypothyroidism   . Hypercholesteremia   . Palpitations     palpitations, saw cards 2010, ECHO was told neg     Past Surgical History  Procedure Laterality Date  . Appendectomy  2010  . Wisdom tooth extraction    . Tonsillectomy and adenoidectomy      History   Social History  . Marital Status: Married    Spouse Name: N/A    Number of Children: 2  . Years of Education: N/A   Occupational History  . homemaker    Social History Main Topics  . Smoking status: Never Smoker   . Smokeless tobacco: Never Used  . Alcohol Use: Yes     Comment: 3 times per week  . Drug Use: No  . Sexual Activity: Yes    Birth Control/ Protection: Post-menopausal   Other Topics Concern  . Not on file   Social History Narrative   husband just dx w/ prostate ca                     Medication List       This list is accurate as of: 10/23/14  7:56 PM.  Always use your most recent med list.               AMBULATORY NON FORMULARY MEDICATION  - Nature Thyroid .75 grain  - Take 1 tablet by mouth twice a day     aspirin 81 MG tablet  Take 81 mg by mouth daily.     Co Q 10 100 MG Caps  Take 1 capsule by mouth daily.     DHEA PO  Take 1 tablet by mouth daily. 15mg      fish oil-omega-3 fatty acids 1000 MG capsule  Take 2 g by mouth daily.     levothyroxine 50 MCG tablet  Commonly known as:  SYNTHROID, LEVOTHROID     multivitamin tablet  Take 1 tablet by mouth daily.     pregabalin 75 MG capsule  Commonly known as:  LYRICA  Take 1 capsule (75 mg total) by mouth at bedtime.     PROBIOTIC DAILY PO  Take 1 tablet by mouth daily.     Vitamin D 2000 UNITS Caps  Take 1 capsule by mouth daily.           Objective:   Physical Exam BP 133/80 mmHg  Pulse 77  Temp(Src) 98 F (36.7 C) (Oral)  Ht 5\' 7"  (1.702 m)  Wt 144 lb (65.318 kg)  BMI 22.55 kg/m2  SpO2 97%  General -- alert, well-developed, NAD.  Neck --no thyromegaly  HEENT-- Not pale.   Lungs -- normal respiratory effort, no intercostal retractions, no accessory muscle use, and normal breath sounds.  Heart-- normal rate, regular rhythm, no murmur.  Abdomen-- Not distended, good bowel sounds,soft, non-tender. No bruit  Extremities-- no pretibial edema bilaterally ; normal female pedal pulses Neurologic--  alert & oriented X3. Speech normal, gait appropriate for age, strength symmetric and appropriate for age.  Psych-- Cognition and judgment appear intact. Cooperative with normal attention span and concentration. No anxious or depressed appearing.        Assessment & Plan:

## 2014-10-23 NOTE — Progress Notes (Signed)
Pre visit review using our clinic review tool, if applicable. No additional management support is needed unless otherwise documented below in the visit note. 

## 2014-10-29 ENCOUNTER — Ambulatory Visit: Payer: Self-pay | Admitting: Podiatry

## 2015-05-22 ENCOUNTER — Ambulatory Visit: Payer: Self-pay | Admitting: Podiatry

## 2015-06-11 ENCOUNTER — Other Ambulatory Visit (HOSPITAL_COMMUNITY): Payer: Self-pay | Admitting: Obstetrics and Gynecology

## 2015-06-11 DIAGNOSIS — Z1231 Encounter for screening mammogram for malignant neoplasm of breast: Secondary | ICD-10-CM

## 2015-06-12 ENCOUNTER — Institutional Professional Consult (permissible substitution): Payer: BLUE CROSS/BLUE SHIELD | Admitting: Sports Medicine

## 2015-06-18 ENCOUNTER — Ambulatory Visit (HOSPITAL_COMMUNITY): Payer: BLUE CROSS/BLUE SHIELD

## 2015-06-19 ENCOUNTER — Ambulatory Visit (INDEPENDENT_AMBULATORY_CARE_PROVIDER_SITE_OTHER): Payer: BLUE CROSS/BLUE SHIELD

## 2015-06-19 ENCOUNTER — Encounter: Payer: Self-pay | Admitting: Sports Medicine

## 2015-06-19 ENCOUNTER — Ambulatory Visit (INDEPENDENT_AMBULATORY_CARE_PROVIDER_SITE_OTHER): Payer: BLUE CROSS/BLUE SHIELD | Admitting: Sports Medicine

## 2015-06-19 VITALS — BP 137/67 | HR 79 | Ht 67.0 in | Wt 139.0 lb

## 2015-06-19 DIAGNOSIS — M25562 Pain in left knee: Secondary | ICD-10-CM

## 2015-06-19 DIAGNOSIS — M25561 Pain in right knee: Secondary | ICD-10-CM

## 2015-06-19 MED ORDER — MELOXICAM 15 MG PO TABS: ORAL_TABLET | ORAL | Status: AC

## 2015-06-19 NOTE — Assessment & Plan Note (Signed)
Anterolateral knee pain suggestive of a degenerative meniscal tear. Rehabilitation exercises, x-rays, meloxicam. Return for custom orthotics.

## 2015-06-19 NOTE — Progress Notes (Signed)
   Subjective:    I'm seeing this patient as a consultation for:  Dr. Mylinda Latina  CC: Left knee pain  HPI: This is a pleasant 59 year old female who has been bounced around quite a bit, for years now she's had pain that she localizes on the anterolateral aspect of her left knee, moderate, persistent without radiation, it is quite intermittent, and she denies any mechanical symptoms or swelling. She was seen by her PCP who diagnosed with restless leg syndrome, subsequent she was referred to rheumatology who did an extensive lab workup, all of which was negative.  She was prescribed Celebrex which she has not filled or taken.  She was then referred back to her PCP. Subsequently she went to Dr. Tye Savoy at [place] and was then referred to me.  Pain is worse when laying in bed, worse when keeping her knees bent for prolonged periods of time, she really denies any back pain, and denies any leg paresthesias. Again no swelling, no mechanical symptoms. Not significantly worse with going up and down stairs.  Past medical history, Surgical history, Family history not pertinant except as noted below, Social history, Allergies, and medications have been entered into the medical record, reviewed, and no changes needed.   Review of Systems: No headache, visual changes, nausea, vomiting, diarrhea, constipation, dizziness, abdominal pain, skin rash, fevers, chills, night sweats, weight loss, swollen lymph nodes, body aches, joint swelling, muscle aches, chest pain, shortness of breath, mood changes, visual or auditory hallucinations.   Objective:   General: Well Developed, well nourished, and in no acute distress.  Neuro/Psych: Alert and oriented x3, extra-ocular muscles intact, able to move all 4 extremities, sensation grossly intact. Skin: Warm and dry, no rashes noted.  Respiratory: Not using accessory muscles, speaking in full sentences, trachea midline.  Cardiovascular: Pulses  palpable, no extremity edema. Abdomen: Does not appear distended. Left Knee: Normal to inspection with no erythema or effusion or obvious bony abnormalities. Only minimal tenderness to palpation at the anterolateral joint line. ROM normal in flexion and extension and lower leg rotation. Ligaments with solid consistent endpoints including ACL, PCL, LCL, MCL. Negative McMurray's, Apley's grind, and Thessaly tests. No pain with terminal flexion. Non painful patellar compression. Patellar and quadriceps tendons unremarkable. Hamstring and quadriceps strength is normal.  I did personally review her x-rays which show a bit of spurring at the tibial spines as well as a significant degenerative osteophyte at the lateral patellar facet  Impression and Recommendations:   This case required medical decision making of moderate complexity.

## 2015-07-07 ENCOUNTER — Encounter: Payer: BLUE CROSS/BLUE SHIELD | Admitting: Sports Medicine

## 2015-08-05 ENCOUNTER — Ambulatory Visit (INDEPENDENT_AMBULATORY_CARE_PROVIDER_SITE_OTHER): Payer: BLUE CROSS/BLUE SHIELD | Admitting: Internal Medicine

## 2015-08-05 ENCOUNTER — Encounter: Payer: Self-pay | Admitting: Internal Medicine

## 2015-08-05 VITALS — BP 116/74 | HR 72 | Temp 98.3°F | Ht 67.0 in | Wt 142.1 lb

## 2015-08-05 DIAGNOSIS — Z1159 Encounter for screening for other viral diseases: Secondary | ICD-10-CM

## 2015-08-05 DIAGNOSIS — M25562 Pain in left knee: Secondary | ICD-10-CM | POA: Diagnosis not present

## 2015-08-05 DIAGNOSIS — Z23 Encounter for immunization: Secondary | ICD-10-CM | POA: Diagnosis not present

## 2015-08-05 DIAGNOSIS — Z114 Encounter for screening for human immunodeficiency virus [HIV]: Secondary | ICD-10-CM

## 2015-08-05 DIAGNOSIS — R739 Hyperglycemia, unspecified: Secondary | ICD-10-CM | POA: Diagnosis not present

## 2015-08-05 DIAGNOSIS — Z09 Encounter for follow-up examination after completed treatment for conditions other than malignant neoplasm: Secondary | ICD-10-CM

## 2015-08-05 NOTE — Progress Notes (Signed)
Pre visit review using our clinic review tool, if applicable. No additional management support is needed unless otherwise documented below in the visit note. 

## 2015-08-05 NOTE — Patient Instructions (Signed)
Get your blood work before you leave      Next visit  for a   complete physical exam in 4-5 months, fasting Please schedule an appointment at the front desk

## 2015-08-05 NOTE — Progress Notes (Signed)
Subjective:    Patient ID: Bonnie Barron, female    DOB: [birth year], 59 y.o.   MRN: [id number]  DOS:  08/05/2015 Type of visit - description : Several concerns Interval history: Patient's gynecologist check her blood sugar, hyperglycemia a few months ago? No readings available. Would like to be tested for diabetes RLS? Did a trial with Lyrica, did not improve and it make her feel funny. Currently taking no medication. In reviewing her symptoms, at this time described as a deep ache at the hip and left knee, more noticeable at night but sometimes during the daytime. No swelling, pain does not change with walking.    Review of Systems Her husband has a number of health challenges, the patient is doing emotionally well without anxiety or depression.   Past Medical History  Diagnosis Date  . Ovarian cyst   . Varicose veins   . Hypothyroidism   . Hypercholesteremia   . Palpitations     palpitations, saw cards 2010, ECHO was told neg     Past Surgical History  Procedure Laterality Date  . Appendectomy  2010  . Wisdom tooth extraction    . Tonsillectomy and adenoidectomy      Social History   Social History  . Marital Status: Married    Spouse Name: N/A  . Number of Children: 2  . Years of Education: N/A   Occupational History  . homemaker    Social History Main Topics  . Smoking status: Never Smoker   . Smokeless tobacco: Never Used  . Alcohol Use: Yes     Comment: 3 times per week  . Drug Use: No  . Sexual Activity: Yes    Birth Control/ Protection: Post-menopausal   Other Topics Concern  . Not on file   Social History Narrative   husband just dx w/ prostate ca, s/p surgery ~ 2015   Also dx w/ bladder cancer                      Medication List       This list is accurate as of: 08/05/15 11:59 PM.  Always use your most recent med list.               AMBULATORY NON FORMULARY MEDICATION  Nature Thyroid .75 grain Take 1 tablet by mouth twice a day      aspirin 81 MG tablet  Take 81 mg by mouth daily.     Co Q 10 100 MG Caps  Take 1 capsule by mouth daily.     DHEA PO  Take 1 tablet by mouth daily. 15mg      fish oil-omega-3 fatty acids 1000 MG capsule  Take 2 g by mouth daily.     levothyroxine 50 MCG tablet  Commonly known as:  SYNTHROID, LEVOTHROID     meloxicam 15 MG tablet  Commonly known as:  MOBIC  One tab PO qAM with breakfast for 2 weeks, then daily prn pain.     multivitamin tablet  Take 1 tablet by mouth daily.     PROBIOTIC DAILY PO  Take 1 tablet by mouth daily.     Vitamin D 2000 UNITS Caps  Take 1 capsule by mouth daily.           Objective:   Physical Exam BP 116/74 mmHg  Pulse 72  Temp(Src) 98.3 F (36.8 C) (Oral)  Ht 5\' 7"  (1.702 m)  Wt 142 lb 2 oz (64.467 kg)  BMI 22.25 kg/m2  SpO2 97% General:   Well developed, well nourished . NAD.  HEENT:  Normocephalic . Face symmetric, atraumatic Lungs:  CTA B Normal respiratory effort, no intercostal retractions, no accessory muscle use. Heart: RRR,  no murmur.  No pretibial edema bilaterally  MSK: Hip: Full range of motion without pain. No tender at the trochanteric bursa Knees: No swelling, redness. Range of motion normal. Skin: Not pale. Not jaundice Neurologic:  alert & oriented X3.  Speech normal, gait appropriate for age and unassisted Psych--  Cognition and judgment appear intact.  Cooperative with normal attention span and concentration.  Behavior appropriate. No anxious or depressed appearing.      Assessment & Plan:   Assessment> Hypothyroidism -- f/u elsewhere Hyperlipidemia RLS  10-2014?? H/o Palpitations, saw cardiology 2010, had a echo  Plan: Lower extremity pain: Initially thought to be RLS, failed to improve with Lyrica, today we revisit her symptoms >> DJD?Marland Kitchen Recommend a referral to orthopedic surgery. Hyperglycemia: Dx by her gynecologist, she has a very healthy lifestyle, check A1c Hypothyroidism: Now follow-up  by another practitioner in [facility], flu shot today, check a hep C and HIV.

## 2015-08-06 DIAGNOSIS — Z09 Encounter for follow-up examination after completed treatment for conditions other than malignant neoplasm: Secondary | ICD-10-CM | POA: Insufficient documentation

## 2015-08-06 LAB — HEPATITIS C ANTIBODY: HCV Ab: NEGATIVE

## 2015-08-06 LAB — HEMOGLOBIN A1C: HEMOGLOBIN A1C: 5.7 % (ref 4.6–6.5)

## 2015-08-06 LAB — HIV ANTIBODY (ROUTINE TESTING W REFLEX): HIV 1&2 Ab, 4th Generation: NONREACTIVE

## 2015-08-06 NOTE — Assessment & Plan Note (Signed)
Lower extremity pain: Initially thought to be RLS, failed to improve with Lyrica, today we revisit her symptoms >> DJD?Marland Kitchen Recommend a referral to orthopedic surgery. Hyperglycemia: Dx by her gynecologist, she has a very healthy lifestyle, check A1c Hypothyroidism: Now follow-up by another practitioner in [facility], flu shot today, check a hep C and HIV.

## 2015-08-15 ENCOUNTER — Encounter: Payer: Self-pay | Admitting: Sports Medicine

## 2015-08-15 ENCOUNTER — Ambulatory Visit (INDEPENDENT_AMBULATORY_CARE_PROVIDER_SITE_OTHER): Payer: BLUE CROSS/BLUE SHIELD | Admitting: Sports Medicine

## 2015-08-15 VITALS — BP 118/71 | HR 80

## 2015-08-15 DIAGNOSIS — M25562 Pain in left knee: Secondary | ICD-10-CM | POA: Diagnosis not present

## 2015-08-15 NOTE — Assessment & Plan Note (Signed)
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 Continue rehabilitation exercises for the knee, adding some for trochanteric bursitis as well, return to see me on an as-needed basis, either for more orthotics or injection into the trochanteric bursa or the knee if needed.

## 2015-08-15 NOTE — Progress Notes (Signed)

## 2015-09-02 LAB — HM PAP SMEAR: HM PAP: NORMAL

## 2015-09-02 LAB — HM MAMMOGRAPHY

## 2015-09-09 ENCOUNTER — Encounter: Payer: Self-pay | Admitting: Internal Medicine

## 2015-09-23 ENCOUNTER — Encounter: Payer: Self-pay | Admitting: Gastroenterology

## 2015-11-13 ENCOUNTER — Ambulatory Visit (AMBULATORY_SURGERY_CENTER): Payer: Self-pay

## 2015-11-13 VITALS — Ht 67.0 in | Wt 145.0 lb

## 2015-11-13 DIAGNOSIS — Z1211 Encounter for screening for malignant neoplasm of colon: Secondary | ICD-10-CM

## 2015-11-13 MED ORDER — NA SULFATE-K SULFATE-MG SULF 17.5-3.13-1.6 GM/177ML PO SOLN: ORAL | Status: DC

## 2015-11-13 NOTE — Progress Notes (Signed)
No egg or soy allergies Not on home 02 No previous anesthesia complications No diet or weight loss meds Sample of Suprep given to pt, lot [id number] exp 09/2017

## 2015-11-27 ENCOUNTER — Encounter: Payer: Self-pay | Admitting: Gastroenterology

## 2015-11-27 ENCOUNTER — Ambulatory Visit (AMBULATORY_SURGERY_CENTER): Payer: BLUE CROSS/BLUE SHIELD | Admitting: Gastroenterology

## 2015-11-27 VITALS — BP 112/57 | HR 65 | Temp 98.2°F | Resp 18 | Ht 67.0 in | Wt 145.0 lb

## 2015-11-27 DIAGNOSIS — Z1211 Encounter for screening for malignant neoplasm of colon: Secondary | ICD-10-CM

## 2015-11-27 DIAGNOSIS — D120 Benign neoplasm of cecum: Secondary | ICD-10-CM

## 2015-11-27 DIAGNOSIS — K635 Polyp of colon: Secondary | ICD-10-CM | POA: Diagnosis not present

## 2015-11-27 DIAGNOSIS — D123 Benign neoplasm of transverse colon: Secondary | ICD-10-CM

## 2015-11-27 MED ORDER — SODIUM CHLORIDE 0.9 % IV SOLN: 500.0000 mL | INTRAVENOUS | Status: DC

## 2015-11-27 NOTE — Op Note (Signed)
[place]  [address] [place], [address]   COLONOSCOPY PROCEDURE REPORT  PATIENT: Bonnie Barron, Bonnie Barron  MR#: [id number] BIRTHDATE: Jul 09, 1956 , 14  yrs. old GENDER: female ENDOSCOPIST: Ladene Artist, MD, [facility] PROCEDURE DATE:  11/27/2015 PROCEDURE:   Colonoscopy, screening and Colonoscopy with snare polypectomy First Screening Colonoscopy - Avg.  risk and is 50 yrs.  old or older - No.  Prior Negative Screening - Now for repeat screening. 10 or more years since last screening  History of Adenoma - Now for follow-up colonoscopy & has been > or = to 3 yrs.  N/A  Polyps removed today? Yes ASA CLASS:   Class II INDICATIONS:Screening for colonic neoplasia and Colorectal Neoplasm Risk Assessment for this procedure is average risk. MEDICATIONS: Monitored anesthesia care and Propofol 270 mg IV  DESCRIPTION OF PROCEDURE:   After the risks benefits and alternatives of the procedure were thoroughly explained, informed consent was obtained.  The digital rectal exam revealed no abnormalities of the rectum.   The LB [id number]  endoscope was introduced through the anus and advanced to the cecum, which was identified by both the appendix and ileocecal valve. No adverse events experienced.   The quality of the prep was good.  (Suprep was used)  The instrument was then slowly withdrawn as the colon was fully examined. Estimated blood loss is zero unless otherwise noted in this procedure report.   COLON FINDINGS: Two sessile polyps measuring 6 mm in size were found in the transverse colon and at the cecum.  Polypectomies were performed with a cold snare.  The resection was complete, the polyp tissue was completely retrieved and sent to histology.   The examination was otherwise normal.  Retroflexed views revealed no abnormalities. The time to cecum = 3.6 Withdrawal time = 11.4   The scope was withdrawn and the procedure completed. COMPLICATIONS: There were no immediate  complications.  ENDOSCOPIC IMPRESSION: 1.   Two sessile polyps in the transverse colon and at the cecum; polypectomies performed with a cold snare 2.   The examination was otherwise normal  RECOMMENDATIONS: 1.  Await pathology results 2.  Repeat colonoscopy in 5 years if polyp(s) adenomatous; otherwise 10 years  eSigned:  Ladene Artist, MD, [facility] 11/27/2015 8:34 AM

## 2015-11-27 NOTE — Progress Notes (Signed)
To pacu vss report to rn

## 2015-11-27 NOTE — Patient Instructions (Signed)
YOU HAD AN ENDOSCOPIC PROCEDURE TODAY AT THE [facility]:   Refer to the procedure report that was given to you for any specific questions about what was found during the examination.  If the procedure report does not answer your questions, please call your gastroenterologist to clarify.  If you requested that your care partner not be given the details of your procedure findings, then the procedure report has been included in a sealed envelope for you to review at your convenience later.  YOU SHOULD EXPECT: Some feelings of bloating in the abdomen. Passage of more gas than usual.  Walking can help get rid of the air that was put into your GI tract during the procedure and reduce the bloating. If you had a lower endoscopy (such as a colonoscopy or flexible sigmoidoscopy) you may notice spotting of blood in your stool or on the toilet paper. If you underwent a bowel prep for your procedure, you may not have a normal bowel movement for a few days.  Please Note:  You might notice some irritation and congestion in your nose or some drainage.  This is from the oxygen used during your procedure.  There is no need for concern and it should clear up in a day or so.  SYMPTOMS TO REPORT IMMEDIATELY:   Following lower endoscopy (colonoscopy or flexible sigmoidoscopy):  Excessive amounts of blood in the stool  Significant tenderness or worsening of abdominal pains  Swelling of the abdomen that is new, acute  Fever of 100F or higher   For urgent or emergent issues, a gastroenterologist can be reached at any hour by calling [phone].   DIET: Your first meal following the procedure should be a small meal and then it is ok to progress to your normal diet. Heavy or fried foods are harder to digest and may make you feel nauseous or bloated.  Likewise, meals heavy in dairy and vegetables can increase bloating.  Drink plenty of fluids but you should avoid alcoholic beverages for 24  hours.  ACTIVITY:  You should plan to take it easy for the rest of today and you should NOT DRIVE or use heavy machinery until tomorrow (because of the sedation medicines used during the test).    FOLLOW UP: Our staff will call the number listed on your records the next business day following your procedure to check on you and address any questions or concerns that you may have regarding the information given to you following your procedure. If we do not reach you, we will leave a message.  However, if you are feeling well and you are not experiencing any problems, there is no need to return our call.  We will assume that you have returned to your regular daily activities without incident.  If any biopsies were taken you will be contacted by phone or by letter within the next 1-3 weeks.  Please call us at [phone] if you have not heard about the biopsies in 3 weeks.    SIGNATURES/CONFIDENTIALITY: You and/or your care partner have signed paperwork which will be entered into your electronic medical record.  These signatures attest to the fact that that the information above on your After Visit Summary has been reviewed and is understood.  Full responsibility of the confidentiality of this discharge information lies with you and/or your care-partner.  Polyps-handout given  Repeat colonoscopy will be determined by pathology.  

## 2015-11-27 NOTE — Progress Notes (Signed)
Called to room to assist during endoscopic procedure.  Patient ID and intended procedure confirmed with present staff. Received instructions for my participation in the procedure from the performing physician.  

## 2015-11-28 ENCOUNTER — Telehealth: Payer: Self-pay | Admitting: *Deleted

## 2015-11-28 NOTE — Telephone Encounter (Signed)
  Follow up Call-  Call back number 11/27/2015  Post procedure Call Back phone  # [phone]  Permission to leave phone message Yes    [facility]

## 2015-12-02 ENCOUNTER — Encounter: Payer: Self-pay | Admitting: Gastroenterology

## 2015-12-11 ENCOUNTER — Ambulatory Visit: Payer: BLUE CROSS/BLUE SHIELD | Admitting: Sports Medicine

## 2015-12-16 ENCOUNTER — Ambulatory Visit: Payer: BLUE CROSS/BLUE SHIELD | Admitting: Sports Medicine

## 2015-12-18 ENCOUNTER — Encounter: Payer: Self-pay | Admitting: Sports Medicine

## 2015-12-18 ENCOUNTER — Ambulatory Visit (INDEPENDENT_AMBULATORY_CARE_PROVIDER_SITE_OTHER): Payer: BLUE CROSS/BLUE SHIELD | Admitting: Sports Medicine

## 2015-12-18 VITALS — BP 128/77 | HR 73 | Resp 16 | Wt 146.6 lb

## 2015-12-18 DIAGNOSIS — M7062 Trochanteric bursitis, left hip: Secondary | ICD-10-CM

## 2015-12-18 DIAGNOSIS — H8113 Benign paroxysmal vertigo, bilateral: Secondary | ICD-10-CM

## 2015-12-18 NOTE — Progress Notes (Signed)
  Subjective:    CC: follow-up  HPI: Left knee pain: Resolved with meloxicam  Left hip pain: Trochanter bursitis, moderate, persistent, no radiation, persistent pain and inability to sleep. Desires interventional treatment today.  Past medical history, Surgical history, Family history not pertinant except as noted below, Social history, Allergies, and medications have been entered into the medical record, reviewed, and no changes needed.   Review of Systems: No fevers, chills, night sweats, weight loss, chest pain, or shortness of breath.   Objective:    General: Well Developed, well nourished, and in no acute distress.  Neuro: Alert and oriented x3, extra-ocular muscles intact, sensation grossly intact.  HEENT: Normocephalic, atraumatic, pupils equal round reactive to light, neck supple, no masses, no lymphadenopathy, thyroid nonpalpable.  Skin: Warm and dry, no rashes. Cardiac: Regular rate and rhythm, no murmurs rubs or gallops, no lower extremity edema.  Respiratory: Clear to auscultation bilaterally. Not using accessory muscles, speaking in full sentences. Left Hip: ROM IR: 60 Deg, ER: 60 Deg, Flexion: 120 Deg, Extension: 100 Deg, Abduction: 45 Deg, Adduction: 45 Deg Strength IR: 5/5, ER: 5/5, Flexion: 5/5, Extension: 5/5, Abduction: 5/5, Adduction: 5/5, abduction strength is weak Pelvic alignment unremarkable to inspection and palpation. Standing hip rotation and gait without trendelenburg / unsteadiness. Greater trochanter with severe tenderness to palpation. No tenderness over piriformis. No SI joint tenderness and normal minimal SI movement.  Procedure: Real-time Ultrasound Guided Injection of Left greater trochanteric bursa Device: GE Logiq E  Verbal informed consent obtained.  Time-out conducted.  Noted no overlying erythema, induration, or other signs of local infection.  Skin prepped in a sterile fashion.  Local anesthesia: Topical Ethyl chloride.  With sterile  technique and under real time ultrasound guidance:  Spinal needle advanced through the gluteus medius tendon to the greater trochanter, 1 mL kenalog 40, 2 mL lidocaine, 2 mL Marcaine injected easily. Completed without difficulty  Pain immediately resolved suggesting accurate placement of the medication.  Advised to call if fevers/chills, erythema, induration, drainage, or persistent bleeding.  Images permanently stored and available for review in the ultrasound unit.  Impression: Technically successful ultrasound guided injection.  Impression and Recommendations:

## 2015-12-18 NOTE — Assessment & Plan Note (Signed)
Injection as above, hip abductor rehabilitation exercises, return in one month

## 2015-12-18 NOTE — Assessment & Plan Note (Deleted)
Recommended vestibular rehab, call for order if desired.

## 2016-09-14 LAB — HM MAMMOGRAPHY

## 2016-09-16 ENCOUNTER — Encounter: Payer: Self-pay | Admitting: Internal Medicine

## 2017-09-19 LAB — HM DEXA SCAN

## 2017-09-20 LAB — VITAMIN D 25 HYDROXY (VIT D DEFICIENCY, FRACTURES): Vit D, 25-Hydroxy: 48

## 2018-10-27 LAB — HM MAMMOGRAPHY

## 2018-10-31 LAB — HM PAP SMEAR

## 2018-11-13 ENCOUNTER — Telehealth: Payer: Self-pay | Admitting: *Deleted

## 2018-11-13 NOTE — Telephone Encounter (Signed)
Received Medical records from [facility]; forwarded to provider/SLS 02/10

## 2018-11-16 ENCOUNTER — Encounter: Payer: Self-pay | Admitting: Internal Medicine

## 2021-04-16 ENCOUNTER — Encounter: Payer: Self-pay | Admitting: Gastroenterology

## 2023-09-29 ENCOUNTER — Other Ambulatory Visit: Payer: Self-pay | Admitting: Obstetrics and Gynecology

## 2023-09-29 DIAGNOSIS — Z1231 Encounter for screening mammogram for malignant neoplasm of breast: Secondary | ICD-10-CM
